# Patient Record
Sex: Male | Born: 1995 | Hispanic: No | Smoking: Former smoker
Health system: Southern US, Community
[De-identification: ages and names within clinical notes are randomized; demographics above are authoritative.]

## PROBLEM LIST (undated history)

## (undated) DIAGNOSIS — L309 Dermatitis, unspecified: Secondary | ICD-10-CM

## (undated) DIAGNOSIS — R040 Epistaxis: Secondary | ICD-10-CM

---
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
## 2017-02-07 ENCOUNTER — Emergency Department (HOSPITAL_BASED_OUTPATIENT_CLINIC_OR_DEPARTMENT_OTHER)
Admission: EM | Admit: 2017-02-07 | Discharge: 2017-02-07 | Disposition: A | Discharge status: Home / Self Care | Payer: Medicaid Other | Attending: Emergency Medicine | Admitting: Emergency Medicine

## 2017-02-07 ENCOUNTER — Encounter (HOSPITAL_BASED_OUTPATIENT_CLINIC_OR_DEPARTMENT_OTHER): Payer: Self-pay | Admitting: *Deleted

## 2017-02-07 DIAGNOSIS — R0602 Shortness of breath: Secondary | ICD-10-CM | POA: Diagnosis present

## 2017-02-07 DIAGNOSIS — F17210 Nicotine dependence, cigarettes, uncomplicated: Secondary | ICD-10-CM | POA: Diagnosis not present

## 2017-02-07 DIAGNOSIS — J4521 Mild intermittent asthma with (acute) exacerbation: Secondary | ICD-10-CM

## 2017-02-07 MED ORDER — ALBUTEROL SULFATE HFA 108 (90 BASE) MCG/ACT IN AERS: 1.0000 | INHALATION_SPRAY | Freq: Four times a day (QID) | RESPIRATORY_TRACT | 0 refills | Status: DC | PRN

## 2017-02-07 MED ORDER — LORATADINE 10 MG PO TABS: 10.0000 mg | ORAL_TABLET | Freq: Every day | ORAL | 0 refills | Status: DC

## 2017-02-07 MED ORDER — ALBUTEROL SULFATE (2.5 MG/3ML) 0.083% IN NEBU
7.5000 mg | INHALATION_SOLUTION | Freq: Once | RESPIRATORY_TRACT | Status: AC
  Administered 2017-02-07: 7.5 mg via RESPIRATORY_TRACT
  Filled 2017-02-07: qty 9

## 2017-02-07 MED ORDER — LORATADINE 10 MG PO TABS
10.0000 mg | ORAL_TABLET | Freq: Once | ORAL | Status: AC
  Administered 2017-02-07: 10 mg via ORAL
  Filled 2017-02-07: qty 1

## 2017-02-07 MED ORDER — IPRATROPIUM-ALBUTEROL 0.5-2.5 (3) MG/3ML IN SOLN
3.0000 mL | Freq: Once | RESPIRATORY_TRACT | Status: AC
  Administered 2017-02-07: 3 mL via RESPIRATORY_TRACT
  Filled 2017-02-07: qty 3

## 2017-02-07 MED ORDER — ALBUTEROL SULFATE HFA 108 (90 BASE) MCG/ACT IN AERS
2.0000 | INHALATION_SPRAY | RESPIRATORY_TRACT | Status: DC
  Administered 2017-02-07: 2 via RESPIRATORY_TRACT
  Filled 2017-02-07: qty 6.7

## 2017-02-07 MED ORDER — PREDNISONE 20 MG PO TABS: ORAL_TABLET | ORAL | 0 refills | Status: DC

## 2017-02-07 MED ORDER — PREDNISONE 50 MG PO TABS
60.0000 mg | ORAL_TABLET | Freq: Once | ORAL | Status: AC
  Administered 2017-02-07: 60 mg via ORAL
  Filled 2017-02-07: qty 1

## 2017-02-07 MED ORDER — BECLOMETHASONE DIPROPIONATE 40 MCG/ACT IN AERS: 2.0000 | INHALATION_SPRAY | Freq: Two times a day (BID) | RESPIRATORY_TRACT | 0 refills | Status: DC

## 2017-02-07 NOTE — ED Triage Notes (Signed)
Pt out of inhalers x 1 weeks states that his doctor will not renew RX presents with [facility] and chest tightness

## 2017-02-07 NOTE — ED Provider Notes (Signed)
[facility]   CSN: [id number] Arrival date & time: 02/07/17  0443     History   Chief Complaint Chief Complaint  Patient presents with  . Shortness of Breath    HPI Gavin Riley is a 21 y.o. male.  The history is provided by the patient.  Wheezing   This is a chronic problem. The current episode started more than 1 week ago. The problem occurs constantly. The problem has been gradually worsening. Pertinent negatives include no chest pain, no fever, no abdominal pain, no vomiting, no diarrhea, no dysuria, no coryza, no ear pain, no headaches, no rhinorrhea, no sore throat, no swollen glands, no neck pain, no cough, no hemoptysis, no sputum production and no rash. The problem's precipitants include smoke. He has tried nothing for the symptoms. The treatment provided no relief. He has had prior ED visits. He has had no prior ICU admissions. His past medical history is significant for asthma.  Out of both his inhalers for one month.  Is smoking.  No leg pain or swelling.  No CP no DOE.  States his doctor will not refill his medications as he has not followed up.    Past Medical History:  Diagnosis Date  . Asthma   . Eczema     There are no active problems to display for this patient.   History reviewed. No pertinent surgical history.     Home Medications    Prior to Admission medications   Medication Sig Start Date End Date Taking? Authorizing Provider  albuterol (PROVENTIL HFA;VENTOLIN HFA) 108 (90 Base) MCG/ACT inhaler Inhale 1-2 puffs into the lungs every 6 (six) hours as needed for wheezing or shortness of breath. 02/07/17   Lutie Pickler, MD  beclomethasone (QVAR) 40 MCG/ACT inhaler Inhale 2 puffs into the lungs 2 (two) times daily. 02/07/17   Khayri Kargbo, MD  loratadine (CLARITIN) 10 MG tablet Take 1 tablet (10 mg total) by mouth daily. 02/07/17   Locke Barrell, MD  predniSONE (DELTASONE) 20 MG tablet 3 tabs po day one, then 2 po daily  x 4 days 02/07/17   Nicanor Alcon, Davia Smyre, MD    Family History History reviewed. No pertinent family history.  Social History Social History  Substance Use Topics  . Smoking status: Current Every Day Smoker    Packs/day: 0.50    Types: Cigarettes  . Smokeless tobacco: Never Used  . Alcohol use No     Allergies   Shrimp [shellfish allergy]   Review of Systems Review of Systems  Constitutional: Negative for fever.  HENT: Negative for ear pain, rhinorrhea and sore throat.   Respiratory: Positive for wheezing. Negative for cough, hemoptysis and sputum production.   Cardiovascular: Negative for chest pain.  Gastrointestinal: Negative for abdominal pain, diarrhea and vomiting.  Genitourinary: Negative for dysuria.  Musculoskeletal: Negative for neck pain.  Skin: Negative for rash.  Neurological: Negative for headaches.  All other systems reviewed and are negative.    Physical Exam Updated Vital Signs BP (!) 131/93   Pulse 67   Temp 97.9 F (36.6 C) (Oral)   Resp 18   Ht 6' (1.829 m)   Wt 83.9 kg (185 lb)   BMI 25.09 kg/m   Physical Exam  Constitutional: He is oriented to person, place, and time. He appears well-developed and well-nourished. No distress.  HENT:  Head: Normocephalic and atraumatic.  Mouth/Throat: No oropharyngeal exudate.  Eyes: Pupils are equal, round, and reactive to light. Conjunctivae are normal.  Neck: Normal range of motion. Neck supple.  Cardiovascular: Normal rate, regular rhythm, normal heart sounds and intact distal pulses.   Pulmonary/Chest: Effort normal. No stridor. No respiratory distress. He has wheezes. He has no rales. He exhibits no tenderness.  Abdominal: Soft. Bowel sounds are normal. There is no tenderness.  Musculoskeletal: Normal range of motion.  Neurological: He is alert and oriented to person, place, and time. He displays normal reflexes.  Skin: Skin is warm and dry. Capillary refill takes less than 2 seconds.  Psychiatric: He  has a normal mood and affect.     ED Treatments / Results   Vitals:   02/07/17 0449 02/07/17 0450  BP:  (!) 131/93  Pulse: 67   Resp:  18  Temp: 97.9 F (36.6 C)    Procedures Procedures (including critical care time)  Medications Ordered in ED Medications  albuterol (PROVENTIL) (2.5 MG/3ML) 0.083% nebulizer solution 7.5 mg (not administered)  ipratropium-albuterol (DUONEB) 0.5-2.5 (3) MG/3ML nebulizer solution 3 mL (not administered)  predniSONE (DELTASONE) tablet 60 mg (not administered)  loratadine (CLARITIN) tablet 10 mg (not administered)    Clear post neb treatment, sats 100% stable for discharge  PERC negative wells 0 highly doubt PE in this very low risk patient  Final Clinical Impressions(s) / ED Diagnoses   Counseled that it is imperative that the patient stop smoking. Follow up with your PMD for recheck this week and to develop an action plan.  The patient is very well appearing and has been observed in the ED.  Strict return precautions given for  intractable rash, swelling or the lips tongue or floor of the mouth, chest pain, dyspnea on exertion, wheezing, weakness, vomiting, diarrhea,  Inability to tolerate liquids or food, fevers > 101 that are uncontrolled, neck stiffness, rashes on the skin, altered mental status or any concerns. No signs of systemic illness or infection. The patient is nontoxic-appearing on exam and vital signs are within normal limits.    I have reviewed the triage vital signs and the nursing notes. Pertinent labs &imaging results that were available during my care of the patient were reviewed by me and considered in my medical decision making (see chart for details).  After history, exam, and medical workup I feel the patient has been appropriately medically screened and is safe for discharge home. Pertinent diagnoses were discussed with the patient. Patient was given return precautions.   New Prescriptions New Prescriptions   ALBUTEROL  (PROVENTIL HFA;VENTOLIN HFA) 108 (90 BASE) MCG/ACT INHALER    Inhale 1-2 puffs into the lungs every 6 (six) hours as needed for wheezing or shortness of breath.   BECLOMETHASONE (QVAR) 40 MCG/ACT INHALER    Inhale 2 puffs into the lungs 2 (two) times daily.   LORATADINE (CLARITIN) 10 MG TABLET    Take 1 tablet (10 mg total) by mouth daily.   PREDNISONE (DELTASONE) 20 MG TABLET    3 tabs po day one, then 2 po daily x 4 days     Abdo Denault, MD 02/07/17 1610

## 2017-04-30 ENCOUNTER — Encounter (HOSPITAL_BASED_OUTPATIENT_CLINIC_OR_DEPARTMENT_OTHER): Payer: Self-pay | Admitting: *Deleted

## 2017-04-30 ENCOUNTER — Emergency Department (HOSPITAL_BASED_OUTPATIENT_CLINIC_OR_DEPARTMENT_OTHER)
Admission: EM | Admit: 2017-04-30 | Discharge: 2017-05-01 | Disposition: A | Discharge status: Home / Self Care | Payer: Medicaid Other | Attending: Emergency Medicine | Admitting: Emergency Medicine

## 2017-04-30 DIAGNOSIS — F17210 Nicotine dependence, cigarettes, uncomplicated: Secondary | ICD-10-CM | POA: Diagnosis not present

## 2017-04-30 DIAGNOSIS — J4541 Moderate persistent asthma with (acute) exacerbation: Secondary | ICD-10-CM | POA: Diagnosis not present

## 2017-04-30 DIAGNOSIS — Z79899 Other long term (current) drug therapy: Secondary | ICD-10-CM | POA: Diagnosis not present

## 2017-04-30 DIAGNOSIS — R0602 Shortness of breath: Secondary | ICD-10-CM | POA: Diagnosis present

## 2017-04-30 MED ORDER — IPRATROPIUM-ALBUTEROL 0.5-2.5 (3) MG/3ML IN SOLN
3.0000 mL | Freq: Four times a day (QID) | RESPIRATORY_TRACT | Status: DC
  Administered 2017-04-30: 3 mL via RESPIRATORY_TRACT

## 2017-04-30 MED ORDER — ALBUTEROL SULFATE (2.5 MG/3ML) 0.083% IN NEBU
INHALATION_SOLUTION | RESPIRATORY_TRACT | Status: AC
  Administered 2017-04-30: 2.5 mg
  Filled 2017-04-30: qty 3

## 2017-04-30 MED ORDER — ALBUTEROL (5 MG/ML) CONTINUOUS INHALATION SOLN
15.0000 mg/h | INHALATION_SOLUTION | Freq: Once | RESPIRATORY_TRACT | Status: AC
  Administered 2017-04-30: 15 mg/h via RESPIRATORY_TRACT

## 2017-04-30 MED ORDER — IPRATROPIUM-ALBUTEROL 0.5-2.5 (3) MG/3ML IN SOLN
RESPIRATORY_TRACT | Status: AC
  Administered 2017-04-30: 3 mL via RESPIRATORY_TRACT
  Filled 2017-04-30: qty 3

## 2017-04-30 MED ORDER — ALBUTEROL (5 MG/ML) CONTINUOUS INHALATION SOLN
INHALATION_SOLUTION | RESPIRATORY_TRACT | Status: AC
  Administered 2017-04-30: 15 mg/h via RESPIRATORY_TRACT
  Filled 2017-04-30: qty 3

## 2017-04-30 MED ORDER — DEXAMETHASONE SODIUM PHOSPHATE 10 MG/ML IJ SOLN
10.0000 mg | Freq: Once | INTRAMUSCULAR | Status: AC
  Administered 2017-04-30: 10 mg via INTRAVENOUS
  Filled 2017-04-30: qty 1

## 2017-04-30 NOTE — ED Notes (Signed)
RT at bedside to admin breathing Tx. Dr. Wilkie AyeHorton in to eval

## 2017-04-30 NOTE — ED Triage Notes (Signed)
Hx of asthma, no inhaler, having difficulty speaking single words.

## 2017-05-01 ENCOUNTER — Emergency Department (HOSPITAL_BASED_OUTPATIENT_CLINIC_OR_DEPARTMENT_OTHER): Payer: Medicaid Other

## 2017-05-01 ENCOUNTER — Other Ambulatory Visit: Payer: Self-pay

## 2017-05-01 MED ORDER — BECLOMETHASONE DIPROPIONATE 40 MCG/ACT IN AERS: 2.0000 | INHALATION_SPRAY | Freq: Two times a day (BID) | RESPIRATORY_TRACT | 0 refills | Status: DC

## 2017-05-01 MED ORDER — ALBUTEROL SULFATE HFA 108 (90 BASE) MCG/ACT IN AERS: 2.0000 | INHALATION_SPRAY | RESPIRATORY_TRACT | 0 refills | Status: DC | PRN

## 2017-05-01 MED ORDER — ALBUTEROL SULFATE HFA 108 (90 BASE) MCG/ACT IN AERS
2.0000 | INHALATION_SPRAY | Freq: Once | RESPIRATORY_TRACT | Status: AC
  Administered 2017-05-01: 2 via RESPIRATORY_TRACT
  Filled 2017-05-01: qty 6.7

## 2017-05-01 NOTE — Progress Notes (Signed)
Albuterol mdi with spacer given to patient with instruction for use.  Patient with good understanding and return demonstration of proper technique.

## 2017-05-01 NOTE — ED Provider Notes (Signed)
[facility] EMERGENCY DEPARTMENT Provider Note   CSN: [id number] Arrival date & time: 04/30/17  2330     History   Chief Complaint Chief Complaint  Patient presents with  . Asthma    HPI Farron Carollee MassedS Soward is a 21 y.o. male.  HPI  Level 5 caveat for acuity of condition.  Patient presents with acute onset shortness of breath.  Initially he was unable to speak with increased work of breathing and wheezing.  Very limited history given patient's acuity of condition.  On recheck, after continuous DuoNeb.  Patient states he feels much better.  He can speak in short sentences.  No accessory muscle use or increased work of breathing.  Patient states over the last 2-3 weeks he has had increasing shortness of breath.  Cough and shortness of breath or works at night.  He often times has to stand up and move around.  Denies any recent fevers.  He is not currently taking any inhalers or daily asthma medications.  He has run out.  Denies any recent illnesses.  Past Medical History:  Diagnosis Date  . Asthma   . Eczema     There are no active problems to display for this patient.   History reviewed. No pertinent surgical history.     Home Medications    Prior to Admission medications   Medication Sig Start Date End Date Taking? Authorizing Provider  albuterol (PROVENTIL HFA;VENTOLIN HFA) 108 (90 Base) MCG/ACT inhaler Inhale 2 puffs every 4 (four) hours as needed into the lungs for wheezing or shortness of breath. 05/01/17   Horton, Mayer Maskerourtney F, MD  beclomethasone (QVAR) 40 MCG/ACT inhaler Inhale 2 puffs 2 (two) times daily into the lungs. 05/01/17   Horton, Mayer Maskerourtney F, MD  loratadine (CLARITIN) 10 MG tablet Take 1 tablet (10 mg total) by mouth daily. 02/07/17   Palumbo, April, MD  predniSONE (DELTASONE) 20 MG tablet 3 tabs po day one, then 2 po daily x 4 days 02/07/17   Nicanor AlconPalumbo, April, MD    Family History No family history on file.  Social History Social History    Tobacco Use  . Smoking status: Current Every Day Smoker    Packs/day: 0.50    Types: Cigarettes  . Smokeless tobacco: Never Used  Substance Use Topics  . Alcohol use: No  . Drug use: No     Allergies   Shrimp [shellfish allergy]   Review of Systems Review of Systems  Constitutional: Positive for fever.  Respiratory: Positive for cough, shortness of breath and wheezing.   Cardiovascular: Negative for chest pain.  Gastrointestinal: Negative for abdominal pain, nausea and vomiting.  All other systems reviewed and are negative.    Physical Exam Updated Vital Signs BP 136/85   Pulse (!) 103   Temp 98.4 F (36.9 C) (Axillary)   Resp 14   SpO2 100%   Physical Exam  Constitutional: He is oriented to person, place, and time.  Ill-appearing, increased respiratory rate, accessory muscle use  HENT:  Head: Normocephalic and atraumatic.  Cardiovascular: Regular rhythm and normal heart sounds.  No murmur heard. Tachycardia  Pulmonary/Chest: He is in respiratory distress. He has wheezes.  Increased respiratory rate, unable to speak except for short phrases and words, accessory muscle use, tight with expiratory wheeze in all lung fields  Abdominal: Soft. Bowel sounds are normal. There is no tenderness. There is no rebound.  Musculoskeletal: He exhibits no edema.  Neurological: He is alert and oriented to person, place, and  time.  Skin: Skin is warm and dry.  Psychiatric: He has a normal mood and affect.  Nursing note and vitals reviewed.    ED Treatments / Results  Labs (all labs ordered are listed, but only abnormal results are displayed) Labs Reviewed - No data to display  EKG  EKG Interpretation None       Radiology Dg Chest 2 View  Result Date: 05/01/2017 CLINICAL DATA:  21 year old male with asthma. Patient presenting with shortness of breath. EXAM: CHEST  2 VIEW COMPARISON:  Chest radiograph dated 07/06/2015 FINDINGS: The heart size and mediastinal  contours are within normal limits. Both lungs are clear. The visualized skeletal structures are unremarkable. IMPRESSION: No active cardiopulmonary disease. Electronically Signed   By: Elgie CollardArash  Radparvar M.D.   On: 05/01/2017 01:44    Procedures Procedures (including critical care time)  CRITICAL CARE Performed by: Shon BatonHORTON, COURTNEY F   Total critical care time: 25 minutes  Critical care time was exclusive of separately billable procedures and treating other patients.  Critical care was necessary to treat or prevent imminent or life-threatening deterioration.  Critical care was time spent personally by me on the following activities: development of treatment plan with patient and/or surrogate as well as nursing, discussions with consultants, evaluation of patient's response to treatment, examination of patient, obtaining history from patient or surrogate, ordering and performing treatments and interventions, ordering and review of laboratory studies, ordering and review of radiographic studies, pulse oximetry and re-evaluation of patient's condition.   Medications Ordered in ED Medications  ipratropium-albuterol (DUONEB) 0.5-2.5 (3) MG/3ML nebulizer solution 3 mL (3 mLs Nebulization Given 04/30/17 2335)  albuterol (PROVENTIL HFA;VENTOLIN HFA) 108 (90 Base) MCG/ACT inhaler 2 puff (not administered)  dexamethasone (DECADRON) injection 10 mg (10 mg Intravenous Given 04/30/17 2345)  albuterol (PROVENTIL,VENTOLIN) solution continuous neb (15 mg/hr Nebulization Given 04/30/17 2350)  albuterol (PROVENTIL) (2.5 MG/3ML) 0.083% nebulizer solution (2.5 mg  Given 04/30/17 2335)     Initial Impression / Assessment and Plan / ED Course  I have reviewed the triage vital signs and the nursing notes.  Pertinent labs & imaging results that were available during my care of the patient were reviewed by me and considered in my medical decision making (see chart for details).  Clinical Course as of May 01 217  [facility]  0139 Feels much better.  No longer in respiratory distress.  Fair air movement with occasional wheeze noted.  [CH]  0217 Ambulated to maintain pulse ox to 96%.  Has better aeration.  Will discharge home with inhalers.  [CH]    Clinical Course User Index [CH] Horton, Mayer Maskerourtney F, MD    Patient presents with wheezing.  Initially was unable to provide much history.  He does have a history of asthma and is not compliant with a daily Qvar.  He was given a continuous neb and Decadron.  After continuous neb, he is much improved.  See rechecks above.  Denies any infectious symptoms.  Chest x-ray is clear.  Suspect recent change in weather causing worsening nighttime symptoms of asthma.  He was able to ambulate and maintain his pulse ox.  He was provided with an inhaler.  Qvar and albuterol refilled.  After history, exam, and medical workup I feel the patient has been appropriately medically screened and is safe for discharge home. Pertinent diagnoses were discussed with the patient. Patient was given return precautions.   Final Clinical Impressions(s) / ED Diagnoses   Final diagnoses:  Moderate persistent  asthma with exacerbation    ED Discharge Orders        Ordered    albuterol (PROVENTIL HFA;VENTOLIN HFA) 108 (90 Base) MCG/ACT inhaler  Every 4 hours PRN     05/01/17 0218    beclomethasone (QVAR) 40 MCG/ACT inhaler  2 times daily     05/01/17 0218       Horton, Mayer Masker, MD 05/01/17 0222

## 2017-05-01 NOTE — ED Notes (Signed)
Pt discharged to home NAD.  

## 2017-05-01 NOTE — ED Notes (Signed)
Pulse ox 93% while ambulating. No complaints from patient. Dr Wilkie AyeHorton notified.

## 2017-06-12 ENCOUNTER — Emergency Department (HOSPITAL_BASED_OUTPATIENT_CLINIC_OR_DEPARTMENT_OTHER)
Admission: EM | Admit: 2017-06-12 | Discharge: 2017-06-12 | Disposition: A | Discharge status: Home / Self Care | Payer: Medicaid Other | Attending: Emergency Medicine | Admitting: Emergency Medicine

## 2017-06-12 ENCOUNTER — Other Ambulatory Visit: Payer: Self-pay

## 2017-06-12 ENCOUNTER — Encounter (HOSPITAL_BASED_OUTPATIENT_CLINIC_OR_DEPARTMENT_OTHER): Payer: Self-pay | Admitting: Adult Health

## 2017-06-12 DIAGNOSIS — F17210 Nicotine dependence, cigarettes, uncomplicated: Secondary | ICD-10-CM | POA: Insufficient documentation

## 2017-06-12 DIAGNOSIS — J4541 Moderate persistent asthma with (acute) exacerbation: Secondary | ICD-10-CM | POA: Insufficient documentation

## 2017-06-12 DIAGNOSIS — R0602 Shortness of breath: Secondary | ICD-10-CM | POA: Diagnosis present

## 2017-06-12 MED ORDER — ALBUTEROL SULFATE HFA 108 (90 BASE) MCG/ACT IN AERS
2.0000 | INHALATION_SPRAY | Freq: Once | RESPIRATORY_TRACT | Status: AC
  Administered 2017-06-12: 2 via RESPIRATORY_TRACT

## 2017-06-12 MED ORDER — ALBUTEROL (5 MG/ML) CONTINUOUS INHALATION SOLN
15.0000 mg/h | INHALATION_SOLUTION | RESPIRATORY_TRACT | Status: DC
  Administered 2017-06-12: 15 mg/h via RESPIRATORY_TRACT
  Filled 2017-06-12: qty 20

## 2017-06-12 MED ORDER — IPRATROPIUM BROMIDE 0.02 % IN SOLN
1.0000 mg | Freq: Once | RESPIRATORY_TRACT | Status: AC
  Administered 2017-06-12: 1 mg via RESPIRATORY_TRACT
  Filled 2017-06-12: qty 5

## 2017-06-12 MED ORDER — ALBUTEROL SULFATE HFA 108 (90 BASE) MCG/ACT IN AERS: 1.0000 | INHALATION_SPRAY | Freq: Four times a day (QID) | RESPIRATORY_TRACT | 0 refills | Status: DC | PRN

## 2017-06-12 MED ORDER — BUDESONIDE-FORMOTEROL FUMARATE 80-4.5 MCG/ACT IN AERO: 2.0000 | INHALATION_SPRAY | Freq: Every morning | RESPIRATORY_TRACT | 0 refills | Status: DC

## 2017-06-12 MED ORDER — IPRATROPIUM-ALBUTEROL 0.5-2.5 (3) MG/3ML IN SOLN
3.0000 mL | Freq: Once | RESPIRATORY_TRACT | Status: AC
  Administered 2017-06-12: 3 mL via RESPIRATORY_TRACT
  Filled 2017-06-12: qty 3

## 2017-06-12 MED ORDER — PREDNISONE 20 MG PO TABS: 40.0000 mg | ORAL_TABLET | Freq: Every day | ORAL | 0 refills | Status: AC

## 2017-06-12 MED ORDER — ALBUTEROL SULFATE HFA 108 (90 BASE) MCG/ACT IN AERS
2.0000 | INHALATION_SPRAY | Freq: Once | RESPIRATORY_TRACT | Status: DC
  Filled 2017-06-12: qty 6.7

## 2017-06-12 MED ORDER — PREDNISONE 50 MG PO TABS
60.0000 mg | ORAL_TABLET | Freq: Once | ORAL | Status: AC
  Administered 2017-06-12: 21:00:00 60 mg via ORAL
  Filled 2017-06-12: qty 1

## 2017-06-12 MED ORDER — ALBUTEROL SULFATE (2.5 MG/3ML) 0.083% IN NEBU
2.5000 mg | INHALATION_SOLUTION | Freq: Once | RESPIRATORY_TRACT | Status: AC
  Administered 2017-06-12: 2.5 mg via RESPIRATORY_TRACT
  Filled 2017-06-12: qty 3

## 2017-06-12 NOTE — Discharge Instructions (Signed)
We believe that your symptoms are caused today by an exacerbation of your asthma.  Please take the prescribed medications and any medications that you have at home.  Follow up with your doctor as recommended.  If you develop any new or worsening symptoms, including but not limited to fever, persistent vomiting, worsening shortness of breath, or other symptoms that concern you, please return to the Emergency Department immediately. ° ° °Asthma °Asthma is a recurring condition in which the airways tighten and narrow. Asthma can make it difficult to breathe. It can cause coughing, wheezing, and shortness of breath. Asthma episodes, also called asthma attacks, range from minor to life-threatening. Asthma cannot be cured, but medicines and lifestyle changes can help control it. °CAUSES °Asthma is believed to be caused by inherited (genetic) and environmental factors, but its exact cause is unknown. Asthma may be triggered by allergens, lung infections, or irritants in the air. Asthma triggers are different for each person. Common triggers include:  °Animal dander. °Dust mites. °Cockroaches. °Pollen from trees or grass. °Mold. °Smoke. °Air pollutants such as dust, household cleaners, hair sprays, aerosol sprays, paint fumes, strong chemicals, or strong odors. °Cold air, weather changes, and winds (which increase molds and pollens in the air). °Strong emotional expressions such as crying or laughing hard. °Stress. °Certain medicines (such as aspirin) or types of drugs (such as beta-blockers). °Sulfites in foods and drinks. Foods and drinks that may contain sulfites include dried fruit, potato chips, and sparkling grape juice. °Infections or inflammatory conditions such as the flu, a cold, or an inflammation of the nasal membranes (rhinitis). °Gastroesophageal reflux disease (GERD). °Exercise or strenuous activity. °SYMPTOMS °Symptoms may occur immediately after asthma is triggered or many hours later. Symptoms  include: °Wheezing. °Excessive nighttime or early morning coughing. °Frequent or severe coughing with a common cold. °Chest tightness. °Shortness of breath. °DIAGNOSIS  °The diagnosis of asthma is made by a review of your medical history and a physical exam. Tests may also be performed. These may include: °Lung function studies. These tests show how much air you breathe in and out. °Allergy tests. °Imaging tests such as X-rays. °TREATMENT  °Asthma cannot be cured, but it can usually be controlled. Treatment involves identifying and avoiding your asthma triggers. It also involves medicines. There are 2 classes of medicine used for asthma treatment:  °Controller medicines. These prevent asthma symptoms from occurring. They are usually taken every day. °Reliever or rescue medicines. These quickly relieve asthma symptoms. They are used as needed and provide short-term relief. °Your health care provider will help you create an asthma action plan. An asthma action plan is a written plan for managing and treating your asthma attacks. It includes a list of your asthma triggers and how they may be avoided. It also includes information on when medicines should be taken and when their dosage should be changed. An action plan may also involve the use of a device called a peak flow meter. A peak flow meter measures how well the lungs are working. It helps you monitor your condition. °HOME CARE INSTRUCTIONS  °Take medicines only as directed by your health care provider. Speak with your health care provider if you have questions about how or when to take the medicines. °Use a peak flow meter as directed by your health care provider. Record and keep track of readings. °Understand and use the action plan to help minimize or stop an asthma attack without needing to seek medical care. °Control your home environment in the following   ways to help prevent asthma attacks: °Do not smoke. Avoid being exposed to secondhand smoke. °Change  your heating and air conditioning filter regularly. °Limit your use of fireplaces and wood stoves. °Get rid of pests (such as roaches and mice) and their droppings. °Throw away plants if you see mold on them. °Clean your floors and dust regularly. Use unscented cleaning products. °Try to have someone else vacuum for you regularly. Stay out of rooms while they are being vacuumed and for a short while afterward. If you vacuum, use a dust mask from a hardware store, a double-layered or microfilter vacuum cleaner bag, or a vacuum cleaner with a HEPA filter. °Replace carpet with wood, tile, or vinyl flooring. Carpet can trap dander and dust. °Use allergy-proof pillows, mattress covers, and box spring covers. °Wash bed sheets and blankets every week in hot water and dry them in a dryer. °Use blankets that are made of polyester or cotton. °Clean bathrooms and kitchens with bleach. If possible, have someone repaint the walls in these rooms with mold-resistant paint. Keep out of the rooms that are being cleaned and painted. °Wash hands frequently. °SEEK MEDICAL CARE IF:  °You have wheezing, shortness of breath, or a cough even if taking medicine to prevent attacks. °The colored mucus you cough up (sputum) is thicker than usual. °Your sputum changes from clear or white to yellow, green, gray, or bloody. °You have any problems that may be related to the medicines you are taking (such as a rash, itching, swelling, or trouble breathing). °You are using a reliever medicine more than 2-3 times per week. °Your peak flow is still at 50-79% of your personal best after following your action plan for 1 hour. °You have a fever. °SEEK IMMEDIATE MEDICAL CARE IF:  °You seem to be getting worse and are unresponsive to treatment during an asthma attack. °You are short of breath even at rest. °You get short of breath when doing very little physical activity. °You have difficulty eating, drinking, or talking due to asthma symptoms. °You  develop chest pain. °You develop a fast heartbeat. °You have a bluish color to your lips or fingernails. °You are light-headed, dizzy, or faint. °Your peak flow is less than 50% of your personal best. °MAKE SURE YOU:  °Understand these instructions. °Will watch your condition. °Will get help right away if you are not doing well or get worse. °Document Released: 06/06/2005 Document Revised: 10/21/2013 Document Reviewed: 01/03/2013 °Exit[employer]® Patient Information ©2015 [employer]. This information is not intended to replace advice given to you by your health care provider. Make sure you discuss any questions you have with your health care provider. ° °How to Use a Nebulizer °If you have asthma or other breathing problems, you might need to breathe in (inhale) medicine. This can be done with a nebulizer. A nebulizer is a device that turns liquid medicine into a mist that you can inhale.  °There are different kinds of nebulizers. Most are small. With some, you breathe in through a mouthpiece. With others, a mask fits over your nose and mouth. Most nebulizers must be connected to a small air compressor. Air is forced through tubing from the compressor to the nebulizer. The forced air changes the liquid into a fine spray. °RISKS AND COMPLICATIONS °The nebulizer must work properly for it to help your breathing. If the nebulizer does not produce mist, or if foam comes out, this indicates that the nebulizer is not working properly. Sometimes a filter can get clogged, or   there might be a problem with the air compressor. Check the instruction booklet that came with your nebulizer. It should tell you how to fix problems or where to call for help. You should have at least one extra nebulizer at home. That way, you will always have one when you need it.  °HOW TO PREPARE BEFORE USING THE NEBULIZER °Take these steps before using the nebulizer: °Check your medicine. Make sure it has not expired and is not damaged in any way.    °Wash your hands with soap and water.   °Put all the parts of your nebulizer on a sturdy, flat surface. Make sure the tubing connects the compressor and the nebulizer. °Measure the liquid medicine according to your health care provider's instructions. Pour it into the nebulizer. °Attach the mouthpiece or mask.   °Test the nebulizer by turning it on to make sure a spray is coming out. Then, turn it off.   °HOW TO USE THE NEBULIZER °Sit down and focus on staying relaxed.   °If your nebulizer has a mask, put it over your nose and mouth. If you use a mouthpiece, put it in your mouth. Press your lips firmly around the mouthpiece. °Turn on the nebulizer.   °Breathe out.   °Some nebulizers have a finger valve. If yours does, cover up the air hole so the air gets to the nebulizer. °Once the medicine begins to mist out, take slow, deep breaths. If there is a finger valve, release it at the end of your breath. °Continue taking slow, deep breaths until the nebulizer is empty.   °Be sure to stop the machine at any point if you start coughing or if the medicine foams or bubbles. °HOW TO CLEAN THE NEBULIZER  °The nebulizer and all its parts must be kept very clean. Follow the manufacturer's instructions for cleaning. For most nebulizers, you should follow these guidelines: °Wash the nebulizer after each use. Use warm water and soap. Rinse it well. Shake the nebulizer to remove extra water. Put it on a clean towel until it is completely dry. To make sure it is dry, put the nebulizer back together. Turn on the compressor for a few minutes. This will blow air through the nebulizer.   °Do not wash the tubing or the finger valve.   °Store the nebulizer in a dust-free place.   °Inspect the filter every week. Replace it any time it looks dirty.   °Sometimes the nebulizer will need a more complete cleaning. The instruction booklet should say how often you need to do this. °SEEK MEDICAL CARE IF:  °You continue to have difficulty  breathing.   °You have trouble using the nebulizer.   °Document Released: 05/25/2009 Document Revised: 10/21/2013 Document Reviewed: 11/26/2012 °Exit[employer]® Patient Information ©2015 [employer]. This information is not intended to replace advice given to you by your health care provider. Make sure you discuss any questions you have with your health care provider. ° °How to Use an Inhaler °Proper inhaler technique is very important. Good technique ensures that the medicine reaches the lungs. Poor technique results in depositing the medicine on the tongue and back of the throat rather than in the airways. If you do not use the inhaler with good technique, the medicine will not help you. °STEPS TO FOLLOW IF USING AN INHALER WITHOUT AN EXTENSION TUBE °Remove the cap from the inhaler. °If you are using the inhaler for the first time, you will need to prime it. Shake the inhaler for   5 seconds and release four puffs into the air, away from your face. Ask your health care provider or pharmacist if you have questions about priming your inhaler. °Shake the inhaler for 5 seconds before each breath in (inhalation). °Position the inhaler so that the top of the canister faces up. °Put your index finger on the top of the medicine canister. Your thumb supports the bottom of the inhaler. °Open your mouth. °Either place the inhaler between your teeth and place your lips tightly around the mouthpiece, or hold the inhaler 1-2 inches away from your open mouth. If you are unsure of which technique to use, ask your health care provider. °Breathe out (exhale) normally and as completely as possible. °Press the canister down with your index finger to release the medicine. °At the same time as the canister is pressed, inhale deeply and slowly until your lungs are completely filled. This should take 4-6 seconds. Keep your tongue down. °Hold the medicine in your lungs for 5-10 seconds (10 seconds is best). This helps the medicine get into the  small airways of your lungs. °Breathe out slowly, through pursed lips. Whistling is an example of pursed lips. °Wait at least 15-30 seconds between puffs. Continue with the above steps until you have taken the number of puffs your health care provider has ordered. Do not use the inhaler more than your health care provider tells you. °Replace the cap on the inhaler. °Follow the directions from your health care provider or the inhaler insert for cleaning the inhaler. °STEPS TO FOLLOW IF USING AN INHALER WITH AN EXTENSION (SPACER) °Remove the cap from the inhaler. °If you are using the inhaler for the first time, you will need to prime it. Shake the inhaler for 5 seconds and release four puffs into the air, away from your face. Ask your health care provider or pharmacist if you have questions about priming your inhaler. °Shake the inhaler for 5 seconds before each breath in (inhalation). °Place the open end of the spacer onto the mouthpiece of the inhaler. °Position the inhaler so that the top of the canister faces up and the spacer mouthpiece faces you. °Put your index finger on the top of the medicine canister. Your thumb supports the bottom of the inhaler and the spacer. °Breathe out (exhale) normally and as completely as possible. °Immediately after exhaling, place the spacer between your teeth and into your mouth. Close your lips tightly around the spacer. °Press the canister down with your index finger to release the medicine. °At the same time as the canister is pressed, inhale deeply and slowly until your lungs are completely filled. This should take 4-6 seconds. Keep your tongue down and out of the way. °Hold the medicine in your lungs for 5-10 seconds (10 seconds is best). This helps the medicine get into the small airways of your lungs. Exhale. °Repeat inhaling deeply through the spacer mouthpiece. Again hold that breath for up to 10 seconds (10 seconds is best). Exhale slowly. If it is difficult to take  this second deep breath through the spacer, breathe normally several times through the spacer. Remove the spacer from your mouth. °Wait at least 15-30 seconds between puffs. Continue with the above steps until you have taken the number of puffs your health care provider has ordered. Do not use the inhaler more than your health care provider tells you. °Remove the spacer from the inhaler, and place the cap on the inhaler. °Follow the directions from your health care provider   or the inhaler insert for cleaning the inhaler and spacer. °If you are using different kinds of inhalers, use your quick relief medicine to open the airways 10-15 minutes before using a steroid if instructed to do so by your health care provider. If you are unsure which inhalers to use and the order of using them, ask your health care provider, nurse, or respiratory therapist. °If you are using a steroid inhaler, always rinse your mouth with water after your last puff, then gargle and spit out the water. Do not swallow the water. °AVOID: °Inhaling before or after starting the spray of medicine. It takes practice to coordinate your breathing with triggering the spray. °Inhaling through the nose (rather than the mouth) when triggering the spray. °HOW TO DETERMINE IF YOUR INHALER IS FULL OR NEARLY EMPTY °You cannot know when an inhaler is empty by shaking it. A few inhalers are now being made with dose counters. Ask your health care provider for a prescription that has a dose counter if you feel you need that extra help. If your inhaler does not have a counter, ask your health care provider to help you determine the date you need to refill your inhaler. Write the refill date on a calendar or your inhaler canister. Refill your inhaler 7-10 days before it runs out. Be sure to keep an adequate supply of medicine. This includes making sure it is not expired, and that you have a spare inhaler.  °SEEK MEDICAL CARE IF:  °Your symptoms are only partially  relieved with your inhaler. °You are having trouble using your inhaler. °You have some increase in phlegm. °SEEK IMMEDIATE MEDICAL CARE IF:  °You feel little or no relief with your inhalers. You are still wheezing and are feeling shortness of breath or tightness in your chest or both. °You have dizziness, headaches, or a fast heart rate. °You have chills, fever, or night sweats. °You have a noticeable increase in phlegm production, or there is blood in the phlegm. °MAKE SURE YOU:  °Understand these instructions. °Will watch your condition. °Will get help right away if you are not doing well or get worse. °Document Released: 06/03/2000 Document Revised: 03/27/2013 Document Reviewed: 01/03/2013 °Exit[employer]® Patient Information ©2015 [employer]. This information is not intended to replace advice given to you by your health care provider. Make sure you discuss any questions you have with your health care provider. ° ° ° °

## 2017-06-12 NOTE — ED Triage Notes (Signed)
PResents with bilateral inspiratory and expiratory wheezes, speaking in short phrases, increased work of breathing and tachypnea that began today. He has been out of inhaler for a few months.

## 2017-06-12 NOTE — ED Notes (Signed)
[facility] in [facility]. Pt reports sx onset today. C/o sob & wheezing. Denies fever. "Prescribed QVAR, but does not use". Denies smoking (quit).   Alert, NAD, calm, interactive, increased wob, speaking in shortened phrases, wheezing noted, skin W&D, VSS).

## 2017-06-12 NOTE — ED Notes (Signed)
Printed coupon given to pt from [facility]

## 2017-06-12 NOTE — ED Provider Notes (Signed)
Emergency Department Provider Note   I have reviewed the triage vital signs and the nursing notes.   HISTORY  Chief Complaint Asthma   HPI Gavin Riley is a 21 y.o. male with PMH of asthma presents to the emergency department for evaluation of worsening shortness of breath and wheezing.  Symptoms began today.  Patient denies fever.  He has been out of his asthma medications for the past 2 months.  He quit smoking.  No sick contacts.  Denies chest pain or recent travel. No radiation of symptoms. No modifying factors. Patient has Rx for Qvar but cannot afford it and apparently lost his Medicaid.   Past Medical History:  Diagnosis Date  . Asthma   . Eczema     There are no active problems to display for this patient.   History reviewed. No pertinent surgical history.  Current Outpatient Rx  . Order #: [id number] Class: Print  . Order #: [id number] Class: Print  . Order #: [id number] Class: Print  . Order #: [id number] Class: Print    Allergies Shrimp [shellfish allergy]  History reviewed. No pertinent family history.  Social History Social History   Tobacco Use  . Smoking status: Current Every Day Smoker    Packs/day: 0.50    Types: Cigarettes  . Smokeless tobacco: Never Used  Substance Use Topics  . Alcohol use: No  . Drug use: No    Review of Systems  Constitutional: No fever/chills Eyes: No visual changes. ENT: No sore throat. Cardiovascular: Denies chest pain. Respiratory: Positive shortness of breath. Gastrointestinal: No abdominal pain.  No nausea, no vomiting.  No diarrhea.  No constipation. Genitourinary: Negative for dysuria. Musculoskeletal: Negative for back pain. Skin: Negative for rash. Neurological: Negative for headaches, focal weakness or numbness.  10-point ROS otherwise negative.  ____________________________________________   PHYSICAL EXAM:  VITAL SIGNS: ED Triage Vitals  Enc Vitals Group     BP --      Pulse Rate 06/12/17  2023 93     Resp 06/12/17 2023 (!) 26     Temp 06/12/17 2023 98.2 F (36.8 C)     Temp Source 06/12/17 2023 Oral     SpO2 06/12/17 2023 94 %     Weight 06/12/17 2028 185 lb (83.9 kg)   Constitutional: Alert and oriented. Well appearing and in no acute distress. Eyes: Conjunctivae are normal.  Head: Atraumatic. Nose: No congestion/rhinnorhea. Mouth/Throat: Mucous membranes are moist. Neck: No stridor.   Cardiovascular: Normal rate, regular rhythm. Good peripheral circulation. Grossly normal heart sounds.   Respiratory: Positive respiratory effort.  No retractions. Lungs diffuse inspiratory and expiratory wheezing. Poor air entry.  Gastrointestinal: Soft and nontender. No distention.  Musculoskeletal: No lower extremity tenderness nor edema. No gross deformities of extremities. Neurologic:  Normal speech and language. No gross focal neurologic deficits are appreciated.  Skin:  Skin is warm, dry and intact. No rash noted.  ____________________________________________  RADIOLOGY  None ____________________________________________   PROCEDURES  Procedure(s) performed:   Procedures  CRITICAL CARE Performed by: Maia PlanJoshua G Long Total critical care time: 60 minutes Critical care time was exclusive of separately billable procedures and treating other patients. Critical care was necessary to treat or prevent imminent or life-threatening deterioration. Critical care was time spent personally by me on the following activities: development of treatment plan with patient and/or surrogate as well as nursing, discussions with consultants, evaluation of patient's response to treatment, examination of patient, obtaining history from patient or surrogate, ordering and performing treatments and interventions, ordering  and review of laboratory studies, ordering and review of radiographic studies, pulse oximetry and re-evaluation of patient's condition.  Alona BeneJoshua Long, MD Emergency  Medicine  ____________________________________________   INITIAL IMPRESSION / ASSESSMENT AND PLAN / ED COURSE  Pertinent labs & imaging results that were available during my care of the patient were reviewed by me and considered in my medical decision making (see chart for details).  Patient presents with worsening asthma symptoms.  He is been out of his albuterol inhalers for the past 2 months.  Wheezing throughout.  DuoNeb but transition to continuous albuterol.  Continue to monitor closely. Exam is symmetrical. No CXR at this time.   09:26 PM Starting patient on his second hour of CAT. Looking better but has continued wheezing.   Patient feeling much better after 2 hours of CAT. Counseled on smoking cessation. Changed Qvar to Symbicort due to cost issues. [facility].   At this time, I do not feel there is any life-threatening condition present. I have reviewed and discussed all results (EKG, imaging, lab, urine as appropriate), exam findings with patient. I have reviewed nursing notes and appropriate previous records.  I feel the patient is safe to be discharged home without further emergent workup. Discussed usual and customary return precautions. Patient and family (if present) verbalize understanding and are comfortable with this plan.  Patient will follow-up with their primary care provider. If they do not have a primary care provider, information for follow-up has been provided to them. All questions have been answered.  ____________________________________________  FINAL CLINICAL IMPRESSION(S) / ED DIAGNOSES  Final diagnoses:  Moderate persistent asthma with exacerbation     MEDICATIONS GIVEN DURING THIS VISIT:  Medications  ipratropium-albuterol (DUONEB) 0.5-2.5 (3) MG/3ML nebulizer solution 3 mL (3 mLs Nebulization Given 06/12/17 2030)  albuterol (PROVENTIL) (2.5 MG/3ML) 0.083% nebulizer solution 2.5 mg (2.5 mg Nebulization Given 06/12/17  2030)  predniSONE (DELTASONE) tablet 60 mg (60 mg Oral Given 06/12/17 2036)  ipratropium (ATROVENT) nebulizer solution 1 mg (1 mg Nebulization Given 06/12/17 2039)  albuterol (PROVENTIL HFA;VENTOLIN HFA) 108 (90 Base) MCG/ACT inhaler 2 puff (2 puffs Inhalation Given 06/12/17 2237)     Note:  This document was prepared using Dragon voice recognition software and may include unintentional dictation errors.  Alona BeneJoshua Long, MD Emergency Medicine    Long, Arlyss RepressJoshua G, MD 06/13/17 [phone]

## 2017-12-14 ENCOUNTER — Emergency Department (HOSPITAL_COMMUNITY)
Admission: EM | Admit: 2017-12-14 | Discharge: 2017-12-14 | Disposition: A | Discharge status: Home / Self Care | Payer: Medicaid Other | Attending: Emergency Medicine | Admitting: Emergency Medicine

## 2017-12-14 ENCOUNTER — Encounter (HOSPITAL_COMMUNITY): Payer: Self-pay

## 2017-12-14 DIAGNOSIS — R062 Wheezing: Secondary | ICD-10-CM | POA: Diagnosis present

## 2017-12-14 DIAGNOSIS — J4521 Mild intermittent asthma with (acute) exacerbation: Secondary | ICD-10-CM | POA: Diagnosis not present

## 2017-12-14 MED ORDER — ALBUTEROL SULFATE HFA 108 (90 BASE) MCG/ACT IN AERS
2.0000 | INHALATION_SPRAY | Freq: Once | RESPIRATORY_TRACT | Status: DC
Start: 2017-12-14 — End: 2017-12-14
  Filled 2017-12-14: qty 6.7

## 2017-12-14 MED ORDER — ALBUTEROL SULFATE (2.5 MG/3ML) 0.083% IN NEBU
5.0000 mg | INHALATION_SOLUTION | Freq: Once | RESPIRATORY_TRACT | Status: AC
  Administered 2017-12-14: 5 mg via RESPIRATORY_TRACT
  Filled 2017-12-14: qty 6

## 2017-12-14 NOTE — Discharge Instructions (Addendum)
1.  It is very important that you have a family physician  to manage your asthma.  Make an appointment for recheck within 1 to 2 weeks. 2. Asthmatic should not smoke at all.  Quit all smoking.

## 2017-12-14 NOTE — ED Provider Notes (Signed)
[facility] EMERGENCY DEPARTMENT Provider Note   CSN: [id number] Arrival date & time: 12/14/17  0754     History   Chief Complaint Chief Complaint  Patient presents with  . Asthma    HPI Gavin Riley is a 22 y.o. male.  HPI Patient reports he usually has an inhaler if he gets wheezing.  He ran out about a week ago.  He reports he awakened this morning and felt like it was hard to breathe and he was wheezing.  No chest pain.  No cough.  No fever.  Patient reports he usually needs inhaler only intermittently and more often at night in the morning.  Patient reports he smokes occasionally. Past Medical History:  Diagnosis Date  . Asthma   . Eczema     There are no active problems to display for this patient.   History reviewed. No pertinent surgical history.      Home Medications    Prior to Admission medications   Medication Sig Start Date End Date Taking? Authorizing Provider  albuterol (PROVENTIL HFA;VENTOLIN HFA) 108 (90 Base) MCG/ACT inhaler Inhale 1-2 puffs into the lungs every 6 (six) hours as needed for wheezing or shortness of breath. 06/12/17   Long, Arlyss Repress, MD  budesonide-formoterol (SYMBICORT) 80-4.5 MCG/ACT inhaler Inhale 2 puffs into the lungs every morning. 06/12/17 07/12/17  Long, Arlyss Repress, MD  loratadine (CLARITIN) 10 MG tablet Take 1 tablet (10 mg total) by mouth daily. 02/07/17   Palumbo, April, MD    Family History No family history on file.  Social History Social History   Tobacco Use  . Smoking status: Current Every Day Smoker    Packs/day: 0.50    Types: Cigarettes  . Smokeless tobacco: Never Used  Substance Use Topics  . Alcohol use: No  . Drug use: No     Allergies   Shrimp [shellfish allergy]   Review of Systems Review of Systems 10 Systems reviewed and are negative for acute change except as noted in the HPI.  Physical Exam Updated Vital Signs BP 124/71   Pulse 77   Temp 98.6 F (37 C) (Oral)    Resp 15   SpO2 98%   Physical Exam  Constitutional: He is oriented to person, place, and time. He appears well-developed and well-nourished.  HENT:  Head: Normocephalic and atraumatic.  Nose: Nose normal.  Mouth/Throat: Oropharynx is clear and moist.  Eyes: EOM are normal.  Neck: Neck supple.  Cardiovascular: Normal rate, regular rhythm, normal heart sounds and intact distal pulses.  Pulmonary/Chest: Effort normal.  Inspiratory and expiratory wheeze diffusely through bilateral lung fields.  Abdominal: Soft. Bowel sounds are normal. He exhibits no distension. There is no tenderness.  Musculoskeletal: Normal range of motion. He exhibits no edema or tenderness.  Neurological: He is alert and oriented to person, place, and time. He has normal strength. Coordination normal. GCS eye subscore is 4. GCS verbal subscore is 5. GCS motor subscore is 6.  Skin: Skin is warm, dry and intact.  Psychiatric: He has a normal mood and affect.     ED Treatments / Results  Labs (all labs ordered are listed, but only abnormal results are displayed) Labs Reviewed - No data to display  EKG None  Radiology No results found.  Procedures Procedures (including critical care time)  Medications Ordered in ED Medications  albuterol (PROVENTIL HFA;VENTOLIN HFA) 108 (90 Base) MCG/ACT inhaler 2 puff (has no administration in time range)  albuterol (PROVENTIL) (2.5 MG/3ML)  0.083% nebulizer solution 5 mg (5 mg Nebulization Given 12/14/17 0819)     Initial Impression / Assessment and Plan / ED Course  I have reviewed the triage vital signs and the nursing notes.  Pertinent labs & imaging results that were available during my care of the patient were reviewed by me and considered in my medical decision making (see chart for details).  Clinical Course as of Dec 14 852  Thu Dec 14, 2017  0851 Patient feels much better.  Lungs are now clear to auscultation.   [MP]    Clinical Course User Index [MP]  Arby BarrettePfeiffer, Shree Espey, MD    Final Clinical Impressions(s) / ED Diagnoses   Final diagnoses:  Mild intermittent asthma with exacerbation  Patient has history of asthma.  He has run out of inhaler.  No recent fever chills cough or chest pain.  Describes episodic exacerbations.  He responded to nebulizer treatment with complete resolution of wheezing.  Patient is counseled on importance of regular medical care for asthmatics and complete smoking cessation.  He is discharged in good condition peer ED Discharge Orders    None       Arby BarrettePfeiffer, Milderd Manocchio, MD 12/14/17 [id number]

## 2017-12-14 NOTE — ED Triage Notes (Signed)
Pt presents for evaluation of asthma flare this AM. Reports inhaler is empty.

## 2018-01-14 ENCOUNTER — Emergency Department (HOSPITAL_COMMUNITY)
Admission: EM | Admit: 2018-01-14 | Discharge: 2018-01-14 | Disposition: A | Discharge status: Home / Self Care | Payer: Medicaid Other | Attending: Emergency Medicine | Admitting: Emergency Medicine

## 2018-01-14 ENCOUNTER — Encounter (HOSPITAL_COMMUNITY): Payer: Self-pay

## 2018-01-14 DIAGNOSIS — L259 Unspecified contact dermatitis, unspecified cause: Secondary | ICD-10-CM | POA: Insufficient documentation

## 2018-01-14 DIAGNOSIS — J4521 Mild intermittent asthma with (acute) exacerbation: Secondary | ICD-10-CM | POA: Insufficient documentation

## 2018-01-14 DIAGNOSIS — F17210 Nicotine dependence, cigarettes, uncomplicated: Secondary | ICD-10-CM | POA: Insufficient documentation

## 2018-01-14 DIAGNOSIS — L2082 Flexural eczema: Secondary | ICD-10-CM

## 2018-01-14 DIAGNOSIS — Z79899 Other long term (current) drug therapy: Secondary | ICD-10-CM | POA: Insufficient documentation

## 2018-01-14 MED ORDER — PREDNISONE 20 MG PO TABS: 40.0000 mg | ORAL_TABLET | Freq: Every day | ORAL | 0 refills | Status: AC

## 2018-01-14 MED ORDER — ALBUTEROL SULFATE HFA 108 (90 BASE) MCG/ACT IN AERS: 2.0000 | INHALATION_SPRAY | RESPIRATORY_TRACT | 1 refills | Status: DC | PRN

## 2018-01-14 MED ORDER — TRIAMCINOLONE ACETONIDE 0.1 % EX CREA: 1.0000 "application " | TOPICAL_CREAM | Freq: Two times a day (BID) | CUTANEOUS | 0 refills | Status: AC

## 2018-01-14 MED ORDER — ALBUTEROL SULFATE (2.5 MG/3ML) 0.083% IN NEBU
5.0000 mg | INHALATION_SOLUTION | Freq: Once | RESPIRATORY_TRACT | Status: AC
  Administered 2018-01-14: 5 mg via RESPIRATORY_TRACT

## 2018-01-14 MED ORDER — IPRATROPIUM-ALBUTEROL 0.5-2.5 (3) MG/3ML IN SOLN
3.0000 mL | Freq: Once | RESPIRATORY_TRACT | Status: AC
  Administered 2018-01-14: 3 mL via RESPIRATORY_TRACT
  Filled 2018-01-14: qty 3

## 2018-01-14 MED ORDER — ALBUTEROL SULFATE (2.5 MG/3ML) 0.083% IN NEBU
INHALATION_SOLUTION | RESPIRATORY_TRACT | Status: AC
  Filled 2018-01-14: qty 6

## 2018-01-14 MED ORDER — ALBUTEROL SULFATE HFA 108 (90 BASE) MCG/ACT IN AERS
1.0000 | INHALATION_SPRAY | RESPIRATORY_TRACT | Status: DC
  Administered 2018-01-14: 2 via RESPIRATORY_TRACT
  Filled 2018-01-14: qty 6.7

## 2018-01-14 MED ORDER — PREDNISONE 20 MG PO TABS
40.0000 mg | ORAL_TABLET | Freq: Once | ORAL | Status: AC
  Administered 2018-01-14: 40 mg via ORAL
  Filled 2018-01-14: qty 2

## 2018-01-14 NOTE — ED Triage Notes (Signed)
Pt c/o of asthma, out of inhaler for two days, recently around dogs which is one of his triggers, wheezing all lobes.

## 2018-01-14 NOTE — Discharge Instructions (Addendum)
Please consider taking a daily allergy medication to help with your symptoms.  I suggest a less drowsy 24 hour medication such as allegra, zyrtec or Claritin or the generic version.    Please obtain and follow-up with your primary care provider.  If you have any additional, your shortness of breath is not fixed with your albuterol inhaler, or your shortness of breath continues please seek additional medical care.

## 2018-01-14 NOTE — ED Provider Notes (Signed)
[facility] EMERGENCY DEPARTMENT Provider Note   CSN: [id number] Arrival date & time: 01/14/18  1930     History   Chief Complaint Chief Complaint  Patient presents with  . Asthma    HPI Gavin Riley is a 22 y.o. male with a past medical history of asthma, eczema, seasonal allergies, who presents today for evaluation of asthma.  He reports that he has been out of his asthma inhaler for about 2 days.  He has recently been around multiple dogs which is 1 of his triggers.  On triage note he was noted to be wheezing in all lobes.  He reports that after his treatment he still feels slightly tight but feels better.    HPI  Past Medical History:  Diagnosis Date  . Asthma   . Eczema     There are no active problems to display for this patient.   History reviewed. No pertinent surgical history.      Home Medications    Prior to Admission medications   Medication Sig Start Date End Date Taking? Authorizing Provider  albuterol (PROVENTIL HFA;VENTOLIN HFA) 108 (90 Base) MCG/ACT inhaler Inhale 2 puffs into the lungs every 4 (four) hours as needed for wheezing or shortness of breath. 01/14/18   Cristina GongHammond, Elizabeth W, PA-C  budesonide-formoterol [facility](SYMBICORT) 80-4.5 MCG/ACT inhaler Inhale 2 puffs into the lungs every morning. 06/12/17 07/12/17  Long, Arlyss RepressJoshua G, MD  loratadine (CLARITIN) 10 MG tablet Take 1 tablet (10 mg total) by mouth daily. 02/07/17   Palumbo, April, MD  predniSONE (DELTASONE) 20 MG tablet Take 2 tablets (40 mg total) by mouth daily for 3 days. 01/14/18 01/17/18  Cristina GongHammond, Elizabeth W, PA-C  triamcinolone cream (KENALOG) 0.1 % Apply 1 application topically 2 (two) times daily. 01/14/18   Cristina GongHammond, Elizabeth W, PA-C    Family History No family history on file.  Social History Social History   Tobacco Use  . Smoking status: Current Every Day Smoker    Packs/day: 0.50    Types: Cigarettes  . Smokeless tobacco: Never Used  Substance Use Topics  .  Alcohol use: No  . Drug use: No     Allergies   Shrimp [shellfish allergy]   Review of Systems Review of Systems  Constitutional: Negative for chills and fever.  Respiratory: Positive for chest tightness and shortness of breath.   Cardiovascular: Negative for chest pain and palpitations.  Gastrointestinal: Negative for abdominal pain.  Neurological: Negative for headaches.  All other systems reviewed and are negative.    Physical Exam Updated Vital Signs BP 124/81   Pulse 86   Temp 98.8 F (37.1 C) (Oral)   Resp 20   SpO2 96%   Physical Exam  Constitutional: He appears well-developed. No distress.  HENT:  Head: Normocephalic.  Neck: Normal range of motion. Neck supple.  Pulmonary/Chest: Effort normal. No respiratory distress. He has wheezes (Very mild diffusely). He exhibits no tenderness.  Neurological: He is alert.  Skin: Skin is warm and dry. He is not diaphoretic.  Diffuse dry skin, primary on flexor surfaces of arms.   Nursing note and vitals reviewed.    ED Treatments / Results  Labs (all labs ordered are listed, but only abnormal results are displayed) Labs Reviewed - No data to display  EKG None  Radiology No results found.  Procedures Procedures (including critical care time)  Medications Ordered in ED Medications  albuterol (PROVENTIL HFA;VENTOLIN HFA) 108 (90 Base) MCG/ACT inhaler 1-2 puff (2 puffs Inhalation  Given 01/14/18 2044)  albuterol (PROVENTIL) (2.5 MG/3ML) 0.083% nebulizer solution 5 mg (5 mg Nebulization Given 01/14/18 1939)  predniSONE (DELTASONE) tablet 40 mg (40 mg Oral Given 01/14/18 2043)  ipratropium-albuterol (DUONEB) 0.5-2.5 (3) MG/3ML nebulizer solution 3 mL (3 mLs Nebulization Given 01/14/18 2044)     Initial Impression / Assessment and Plan / ED Course  I have reviewed the triage vital signs and the nursing notes.  Pertinent labs & imaging results that were available during my care of the patient were reviewed by me and  considered in my medical decision making (see chart for details).    Patient presents today for evaluation of asthma exacerbation.  He reports that he is been out of his inhalers for about 2 days and recently has been around dogs which is 1 of his triggers.  Triage note reported generalized wheezing in all lobes, he was treated with 5 mg of albuterol prior to my evaluation.  On my exam he had mild wheezing diffusely.  He was treated with p.o. prednisone, DuoNeb, and given an albuterol inhaler with 2 puffs while in the department.  After this he reports feeling like he is back to his normal breathing and is at baseline.  He was given prescriptions for continued prednisone, in addition to 2 inhalers, and steroid cream for his eczema.  He was informed that he needs to start taking oral allergy medicine such as Claritin Allegra or Zyrtec and follow-up with primary care provider.  Return precautions were discussed, states understanding.  Afebrile and given diffuse nature no indication for chest x-ray at this time.  Patient discharged home.  Final Clinical Impressions(s) / ED Diagnoses   Final diagnoses:  Mild intermittent asthma with exacerbation  Flexural eczema    ED Discharge Orders        Ordered    triamcinolone cream (KENALOG) 0.1 %  2 times daily     01/14/18 2114    predniSONE (DELTASONE) 20 MG tablet  Daily     01/14/18 2114    albuterol (PROVENTIL HFA;VENTOLIN HFA) 108 (90 Base) MCG/ACT inhaler  Every 4 hours PRN     01/14/18 2114       Cristina Gong, PA-C 01/14/18 2125    Rolan Bucco, MD 01/14/18 2212

## 2018-01-14 NOTE — ED Notes (Signed)
Pt stable, ambulatory, states understanding of discharge instructions 

## 2018-05-21 ENCOUNTER — Encounter (HOSPITAL_COMMUNITY): Payer: Self-pay | Admitting: *Deleted

## 2018-05-21 ENCOUNTER — Other Ambulatory Visit: Payer: Self-pay

## 2018-05-21 ENCOUNTER — Emergency Department (HOSPITAL_COMMUNITY)
Admission: EM | Admit: 2018-05-21 | Discharge: 2018-05-21 | Discharge status: Against Medical Advice | Payer: Medicaid Other | Attending: Emergency Medicine | Admitting: Emergency Medicine

## 2018-05-21 DIAGNOSIS — Z5321 Procedure and treatment not carried out due to patient leaving prior to being seen by health care provider: Secondary | ICD-10-CM | POA: Insufficient documentation

## 2018-05-21 DIAGNOSIS — R109 Unspecified abdominal pain: Secondary | ICD-10-CM | POA: Diagnosis present

## 2018-05-21 LAB — COMPREHENSIVE METABOLIC PANEL
ALT: 21 U/L (ref 0–44)
AST: 28 U/L (ref 15–41)
Albumin: 4.7 g/dL (ref 3.5–5.0)
Alkaline Phosphatase: 61 U/L (ref 38–126)
Anion gap: 13 (ref 5–15)
BUN: 11 mg/dL (ref 6–20)
CO2: 23 mmol/L (ref 22–32)
CREATININE: 1.2 mg/dL (ref 0.61–1.24)
Calcium: 9.7 mg/dL (ref 8.9–10.3)
Chloride: 103 mmol/L (ref 98–111)
Glucose, Bld: 69 mg/dL — ABNORMAL LOW (ref 70–99)
Potassium: 3.8 mmol/L (ref 3.5–5.1)
Sodium: 139 mmol/L (ref 135–145)
TOTAL PROTEIN: 7.7 g/dL (ref 6.5–8.1)
Total Bilirubin: 1.4 mg/dL — ABNORMAL HIGH (ref 0.3–1.2)

## 2018-05-21 LAB — CBC
HCT: 48.7 % (ref 39.0–52.0)
Hemoglobin: 16.3 g/dL (ref 13.0–17.0)
MCH: 30.8 pg (ref 26.0–34.0)
MCHC: 33.5 g/dL (ref 30.0–36.0)
MCV: 92.1 fL (ref 80.0–100.0)
NRBC: 0 % (ref 0.0–0.2)
PLATELETS: 225 10*3/uL (ref 150–400)
RBC: 5.29 MIL/uL (ref 4.22–5.81)
RDW: 13.3 % (ref 11.5–15.5)
WBC: 4.7 10*3/uL (ref 4.0–10.5)

## 2018-05-21 LAB — LIPASE, BLOOD: Lipase: 31 U/L (ref 11–51)

## 2018-05-21 NOTE — ED Notes (Signed)
Pt stated he couldn't wait any longer. Pt has left

## 2018-05-21 NOTE — ED Triage Notes (Signed)
Pt c/o RUQ pain that started last night after he got home from work. Pt reports taking a deep breath or movement makes pain worse

## 2018-05-22 ENCOUNTER — Emergency Department (HOSPITAL_COMMUNITY): Payer: Medicaid Other

## 2018-05-22 ENCOUNTER — Encounter (HOSPITAL_COMMUNITY): Payer: Self-pay | Admitting: Emergency Medicine

## 2018-05-22 ENCOUNTER — Emergency Department (HOSPITAL_COMMUNITY)
Admission: EM | Admit: 2018-05-22 | Discharge: 2018-05-22 | Disposition: A | Discharge status: Home / Self Care | Payer: Medicaid Other | Attending: Emergency Medicine | Admitting: Emergency Medicine

## 2018-05-22 ENCOUNTER — Other Ambulatory Visit: Payer: Self-pay

## 2018-05-22 DIAGNOSIS — R0781 Pleurodynia: Secondary | ICD-10-CM | POA: Diagnosis not present

## 2018-05-22 DIAGNOSIS — J45909 Unspecified asthma, uncomplicated: Secondary | ICD-10-CM | POA: Insufficient documentation

## 2018-05-22 DIAGNOSIS — F17210 Nicotine dependence, cigarettes, uncomplicated: Secondary | ICD-10-CM | POA: Insufficient documentation

## 2018-05-22 DIAGNOSIS — R0789 Other chest pain: Secondary | ICD-10-CM | POA: Diagnosis present

## 2018-05-22 MED ORDER — NAPROXEN 500 MG PO TABS: 500.0000 mg | ORAL_TABLET | Freq: Two times a day (BID) | ORAL | 0 refills | Status: DC

## 2018-05-22 MED ORDER — NAPROXEN 250 MG PO TABS
500.0000 mg | ORAL_TABLET | Freq: Once | ORAL | Status: AC
  Administered 2018-05-22: 500 mg via ORAL
  Filled 2018-05-22: qty 2

## 2018-05-22 NOTE — ED Notes (Signed)
Patient transported to X-ray 

## 2018-05-22 NOTE — Discharge Instructions (Addendum)
You were evaluated in the Emergency Department and after careful evaluation, we did not find any emergent condition requiring admission or further testing in the hospital.  Your symptoms today seem to be due to inflammation or strain of the muscles of the ribs.  Your chest x-ray was normal.  Please use the anti-inflammatories prescribed as directed and try to limit yourself to light duty while at work for the next few days.  Please return to the Emergency Department if you experience any worsening of your condition.  We encourage you to follow up with a primary care provider.  Thank you for allowing us to be a part of your care.

## 2018-05-22 NOTE — ED Triage Notes (Signed)
Pt complains of sharp pain in right upper abd when he takes a deep breath in. Denies N/V. Denies any injuries

## 2018-05-22 NOTE — ED Notes (Signed)
Discharge instructions and prescription discussed with Pt. Pt verbalized understanding. Pt stable and ambulatory.    

## 2018-05-22 NOTE — ED Provider Notes (Signed)
[facility] Emergency Department Provider Note MRN:  [id number]  Arrival date & time: 05/22/18     Chief Complaint   Rib pain History of Present Illness   Gavin Riley is a 22 y.o. year-old male with a history of asthma presenting to the ED with chief complaint of rib pain.  Patient explains for the past 2 to 3 days, he has had increasing pain to his right anterior ribs/right upper quadrant abdomen.  Patient works at [employer] and lifts heavy boxes, thinks that he pulled a muscle.  Pain is worse with deep breathing.  Denies shortness of breath, no fever, no cough, no lower abdominal pain.  Checked into the emergency department last night but left prior to being evaluated.  Review of Systems  A complete 10 system review of systems was obtained and all systems are negative except as noted in the HPI and PMH.   Patient's Health History    Past Medical History:  Diagnosis Date  . Asthma   . Eczema     History reviewed. No pertinent surgical history.  History reviewed. No pertinent family history.  Social History   Socioeconomic History  . Marital status: Single    Spouse name: Not on file  . Number of children: Not on file  . Years of education: Not on file  . Highest education level: Not on file  Occupational History  . Not on file  Social Needs  . Financial resource strain: Not on file  . Food insecurity:    Worry: Not on file    Inability: Not on file  . Transportation needs:    Medical: Not on file    Non-medical: Not on file  Tobacco Use  . Smoking status: Current Every Day Smoker    Packs/day: 0.50    Types: Cigarettes  . Smokeless tobacco: Never Used  Substance and Sexual Activity  . Alcohol use: No  . Drug use: No  . Sexual activity: Not on file  Lifestyle  . Physical activity:    Days per week: Not on file    Minutes per session: Not on file  . Stress: Not on file  Relationships  . Social connections:    Talks on phone: Not on  file    Gets together: Not on file    Attends religious service: Not on file    Active member of club or organization: Not on file    Attends meetings of clubs or organizations: Not on file    Relationship status: Not on file  . Intimate partner violence:    Fear of current or ex partner: Not on file    Emotionally abused: Not on file    Physically abused: Not on file    Forced sexual activity: Not on file  Other Topics Concern  . Not on file  Social History Narrative  . Not on file     Physical Exam  Vital Signs and Nursing Notes reviewed Vitals:   05/22/18 1156  BP: 134/90  Pulse: 73  Resp: 16  Temp: 98.5 F (36.9 C)  SpO2: 97%    CONSTITUTIONAL: Well-appearing, NAD NEURO:  Alert and oriented x 3, no focal deficits EYES:  eyes equal and reactive ENT/NECK:  no LAD, no JVD CARDIO: Regular rate, well-perfused, normal S1 and S2; tenderness to palpation to the low right anterior ribs PULM:  CTAB no wheezing or rhonchi GI/GU:  normal bowel sounds, non-distended, non-tender MSK/SPINE:  No gross deformities, no edema SKIN:  no rash, atraumatic PSYCH:  Appropriate speech and behavior  Diagnostic and Interventional Summary    Labs Reviewed - No data to display  DG Chest 2 View  Final Result      Medications  naproxen (NAPROSYN) tablet 500 mg (500 mg Oral Given 05/22/18 1303)     Procedures   EMERGENCY DEPARTMENT BILIARY ULTRASOUND INTERPRETATION "Study: Limited Abdominal Ultrasound of the Gallbladder and Common Bile Duct."  INDICATIONS: Abdominal pain Indication: Multiple views of the gallbladder and common bile duct were obtained in real-time with a Multi-frequency probe."  PERFORMED BY:  Myself IMAGES ARCHIVED?: Yes LIMITATIONS: None INTERPRETATION: Normal   Critical Care  ED Course and Medical Decision Making  I have reviewed the triage vital signs and the nursing notes.  Pertinent labs & imaging results that were available during my care of the patient  were reviewed by me and considered in my medical decision making (see below for details).  Favoring MSK strain or costochondritis in this healthy 22 year old male.  Labs obtained last night reassuring.  Bedside ultrasound reveals normal gallbladder.  Will obtain screening chest x-ray to exclude spontaneous pneumothorax.  Chest x-ray unremarkable.  Patient is PERC negative.  Prescription for Naprosyn, strict return precautions for worsening shortness of breath.  After the discussed management above, the patient was determined to be safe for discharge.  The patient was in agreement with this plan and all questions regarding their care were answered.  ED return precautions were discussed and the patient will return to the ED with any significant worsening of condition.  Elmer SowMichael M. Pilar PlateBero, MD [facility] [email or web address]  Final Clinical Impressions(s) / ED Diagnoses     ICD-10-CM   1. Rib pain on right side R07.81 DG Chest 2 View    DG Chest 2 View    ED Discharge Orders         Ordered    naproxen (NAPROSYN) 500 MG tablet  2 times daily     05/22/18 1317             Sabas SousBero, Darren Nodal M, MD 05/22/18 1320

## 2018-07-02 ENCOUNTER — Emergency Department (HOSPITAL_COMMUNITY)
Admission: EM | Admit: 2018-07-02 | Discharge: 2018-07-02 | Disposition: A | Discharge status: Home / Self Care | Payer: Medicaid Other | Attending: Emergency Medicine | Admitting: Emergency Medicine

## 2018-07-02 ENCOUNTER — Encounter (HOSPITAL_COMMUNITY): Payer: Self-pay | Admitting: Emergency Medicine

## 2018-07-02 ENCOUNTER — Other Ambulatory Visit: Payer: Self-pay

## 2018-07-02 DIAGNOSIS — Z79899 Other long term (current) drug therapy: Secondary | ICD-10-CM | POA: Diagnosis not present

## 2018-07-02 DIAGNOSIS — J45901 Unspecified asthma with (acute) exacerbation: Secondary | ICD-10-CM | POA: Insufficient documentation

## 2018-07-02 DIAGNOSIS — F17210 Nicotine dependence, cigarettes, uncomplicated: Secondary | ICD-10-CM | POA: Insufficient documentation

## 2018-07-02 DIAGNOSIS — R0789 Other chest pain: Secondary | ICD-10-CM | POA: Diagnosis present

## 2018-07-02 MED ORDER — ALBUTEROL SULFATE HFA 108 (90 BASE) MCG/ACT IN AERS
1.0000 | INHALATION_SPRAY | Freq: Four times a day (QID) | RESPIRATORY_TRACT | Status: DC | PRN
  Administered 2018-07-02: 1 via RESPIRATORY_TRACT
  Filled 2018-07-02: qty 6.7

## 2018-07-02 MED ORDER — LORATADINE 10 MG PO TABS: 10.0000 mg | ORAL_TABLET | Freq: Every day | ORAL | 0 refills | Status: DC

## 2018-07-02 MED ORDER — IPRATROPIUM-ALBUTEROL 0.5-2.5 (3) MG/3ML IN SOLN
3.0000 mL | Freq: Once | RESPIRATORY_TRACT | Status: AC
  Administered 2018-07-02: 3 mL via RESPIRATORY_TRACT
  Filled 2018-07-02: qty 3

## 2018-07-02 MED ORDER — ALBUTEROL SULFATE HFA 108 (90 BASE) MCG/ACT IN AERS: 1.0000 | INHALATION_SPRAY | Freq: Four times a day (QID) | RESPIRATORY_TRACT | 2 refills | Status: DC | PRN

## 2018-07-02 NOTE — ED Notes (Signed)
Patient verbalizes understanding of discharge instructions. Opportunity for questioning and answers were provided. Armband removed by staff, pt discharged from ED.  

## 2018-07-02 NOTE — Discharge Instructions (Addendum)
Please read attached information. If you experience any new or worsening signs or symptoms please return to the emergency room for evaluation. Please follow-up with your primary care provider or specialist as discussed. Please use medication prescribed only as directed and discontinue taking if you have any concerning signs or symptoms.   °

## 2018-07-02 NOTE — ED Triage Notes (Signed)
Pt arrives to ED from home with complaints of an asthma attack that happened this morning. Pt reports that he is here to get an inhaler. Pt placed in position of comfort with bed locked and lowered, call bell in reach.

## 2018-07-02 NOTE — ED Provider Notes (Signed)
[facility] EMERGENCY DEPARTMENT Provider Note   CSN: [id number] Arrival date & time: 07/02/18  1343     History   Chief Complaint Chief Complaint  Patient presents with  . Asthma    HPI Gavin Riley is a 23 y.o. male.  HPI   23 year old male presents today with complaints of asthma exacerbation.  Patient notes a significant past medical history of asthma.  He notes that he woke up this morning with tightness in his chest typical of previous.  He notes that he has run out of his albuterol.  He denies any productive cough or fever or any respiratory symptoms.  Patient is uncertain what triggered his asthma today.  Patient denies any other complaints.  He notes symptoms have improved from this morning.  Past Medical History:  Diagnosis Date  . Asthma   . Eczema     There are no active problems to display for this patient.   History reviewed. No pertinent surgical history.     Home Medications    Prior to Admission medications   Medication Sig Start Date End Date Taking? Authorizing Provider  albuterol (PROVENTIL HFA;VENTOLIN HFA) 108 (90 Base) MCG/ACT inhaler Inhale 1-2 puffs into the lungs every 6 (six) hours as needed for wheezing or shortness of breath. 07/02/18   Ryosuke Ericksen, Tinnie Gens, PA-C  budesonide-formoterol (SYMBICORT) 80-4.5 MCG/ACT inhaler Inhale 2 puffs into the lungs every morning. 06/12/17 07/12/17  Long, Arlyss Repress, MD  loratadine (CLARITIN) 10 MG tablet Take 1 tablet (10 mg total) by mouth daily. 07/02/18   Darleen Moffitt, Tinnie Gens, PA-C  naproxen (NAPROSYN) 500 MG tablet Take 1 tablet (500 mg total) by mouth 2 (two) times daily. 05/22/18   Sabas Sous, MD  triamcinolone cream (KENALOG) 0.1 % Apply 1 application topically 2 (two) times daily. 01/14/18   Cristina Gong, PA-C    Family History History reviewed. No pertinent family history.  Social History Social History   Tobacco Use  . Smoking status: Current Every Day Smoker   Packs/day: 0.50    Types: Cigarettes  . Smokeless tobacco: Never Used  Substance Use Topics  . Alcohol use: No  . Drug use: No     Allergies   Shrimp [shellfish allergy]   Review of Systems Review of Systems  All other systems reviewed and are negative.   Physical Exam Updated Vital Signs BP 138/90 (BP Location: Right Arm)   Pulse 68   Temp 98.3 F (36.8 C) (Oral)   Resp 20   Ht 6' (1.829 m)   Wt 93 kg   SpO2 100%   BMI 27.80 kg/m   Physical Exam Vitals signs and nursing note reviewed.  Constitutional:      Appearance: He is well-developed.  HENT:     Head: Normocephalic and atraumatic.  Eyes:     General: No scleral icterus.       Right eye: No discharge.        Left eye: No discharge.     Conjunctiva/sclera: Conjunctivae normal.     Pupils: Pupils are equal, round, and reactive to light.  Neck:     Musculoskeletal: Normal range of motion.     Vascular: No JVD.     Trachea: No tracheal deviation.  Pulmonary:     Effort: Pulmonary effort is normal. No respiratory distress.     Breath sounds: Normal breath sounds. No stridor. No wheezing, rhonchi or rales.  Chest:     Chest wall: No tenderness.  Neurological:  Mental Status: He is alert and oriented to person, place, and time.     Coordination: Coordination normal.  Psychiatric:        Behavior: Behavior normal.        Thought Content: Thought content normal.        Judgment: Judgment normal.     ED Treatments / Results  Labs (all labs ordered are listed, but only abnormal results are displayed) Labs Reviewed - No data to display  EKG None  Radiology No results found.  Procedures Procedures (including critical care time)  Medications Ordered in ED Medications  albuterol (PROVENTIL HFA;VENTOLIN HFA) 108 (90 Base) MCG/ACT inhaler 1-2 puff (has no administration in time range)  ipratropium-albuterol (DUONEB) 0.5-2.5 (3) MG/3ML nebulizer solution 3 mL (3 mLs Nebulization Given 07/02/18 1538)      Initial Impression / Assessment and Plan / ED Course  I have reviewed the triage vital signs and the nursing notes.  Pertinent labs & imaging results that were available during my care of the patient were reviewed by me and considered in my medical decision making (see chart for details).       Discharge Meds: Albuterol, Claritin  Assessment/Plan: Patient presents with asthma exacerbation.  Treated with albuterol, discharged with symptomatic care instructions strict return precautions.  He verbalized understanding and agreement to today's plan.   Final Clinical Impressions(s) / ED Diagnoses   Final diagnoses:  Mild asthma with exacerbation, unspecified whether persistent    ED Discharge Orders         Ordered    loratadine (CLARITIN) 10 MG tablet  Daily     07/02/18 1530    albuterol (PROVENTIL HFA;VENTOLIN HFA) 108 (90 Base) MCG/ACT inhaler  Every 6 hours PRN     07/02/18 1530           Daffney Greenly, Josie Dixon 07/02/18 1549    Doug Sou, MD 07/02/18 1956

## 2019-05-16 ENCOUNTER — Emergency Department (HOSPITAL_COMMUNITY)
Admission: EM | Admit: 2019-05-16 | Discharge: 2019-05-16 | Disposition: A | Discharge status: Home / Self Care | Payer: Medicaid Other | Attending: Emergency Medicine | Admitting: Emergency Medicine

## 2019-05-16 ENCOUNTER — Other Ambulatory Visit: Payer: Self-pay

## 2019-05-16 DIAGNOSIS — R0602 Shortness of breath: Secondary | ICD-10-CM | POA: Diagnosis present

## 2019-05-16 DIAGNOSIS — Z79899 Other long term (current) drug therapy: Secondary | ICD-10-CM | POA: Diagnosis not present

## 2019-05-16 DIAGNOSIS — J4521 Mild intermittent asthma with (acute) exacerbation: Secondary | ICD-10-CM | POA: Diagnosis not present

## 2019-05-16 DIAGNOSIS — F17210 Nicotine dependence, cigarettes, uncomplicated: Secondary | ICD-10-CM | POA: Insufficient documentation

## 2019-05-16 MED ORDER — PREDNISONE 20 MG PO TABS: ORAL_TABLET | ORAL | 0 refills | Status: DC

## 2019-05-16 MED ORDER — ALBUTEROL SULFATE HFA 108 (90 BASE) MCG/ACT IN AERS
2.0000 | INHALATION_SPRAY | RESPIRATORY_TRACT | Status: DC | PRN
  Administered 2019-05-16: 19:00:00 2 via RESPIRATORY_TRACT
  Filled 2019-05-16: qty 6.7

## 2019-05-16 NOTE — ED Provider Notes (Signed)
[facility]-EMERGENCY DEPT Provider Note   CSN: [id number] Arrival date & time: 05/16/19  1843     History   Chief Complaint Chief Complaint  Patient presents with   Asthma    HPI Gavin Riley is a 23 y.o. male.     Patient is a 23 year old male who presents with an asthma exacerbation.  He has history of asthma and says that he has frequent attacks.  He uses albuterol at home but has run out.  He denies taking any preventative medications.  He states that he previously was on Qvar but states that it did not really work and he was taken off of it.  He started having shortness of breath earlier this morning and has been progressive throughout the day.  He came in by EMS who gave him an albuterol and Atrovent nebulizer treatment as well as 125 mg of Solu-Medrol.  He currently feels back to baseline and denies any shortness of breath.  He has had a nonproductive cough.  No fevers.  No leg swelling.  No chest pain.     Past Medical History:  Diagnosis Date   Asthma    Eczema     There are no active problems to display for this patient.   No past surgical history on file.      Home Medications    Prior to Admission medications   Medication Sig Start Date End Date Taking? Authorizing Provider  albuterol (PROVENTIL HFA;VENTOLIN HFA) 108 (90 Base) MCG/ACT inhaler Inhale 1-2 puffs into the lungs every 6 (six) hours as needed for wheezing or shortness of breath. 07/02/18   Hedges, Tinnie Gens, PA-C  budesonide-formoterol (SYMBICORT) 80-4.5 MCG/ACT inhaler Inhale 2 puffs into the lungs every morning. 06/12/17 07/12/17  Long, Arlyss Repress, MD  loratadine (CLARITIN) 10 MG tablet Take 1 tablet (10 mg total) by mouth daily. 07/02/18   Hedges, Tinnie Gens, PA-C  naproxen (NAPROSYN) 500 MG tablet Take 1 tablet (500 mg total) by mouth 2 (two) times daily. 05/22/18   Sabas Sous, MD  predniSONE (DELTASONE) 20 MG tablet 2 tabs po daily x 4 days 05/16/19   Rolan Bucco,  MD  triamcinolone cream (KENALOG) 0.1 % Apply 1 application topically 2 (two) times daily. 01/14/18   Cristina Gong, PA-C    Family History No family history on file.  Social History Social History   Tobacco Use   Smoking status: Current Every Day Smoker    Packs/day: 0.50    Types: Cigarettes   Smokeless tobacco: Never Used  Substance Use Topics   Alcohol use: No   Drug use: No     Allergies   Shrimp [shellfish allergy]   Review of Systems Review of Systems  Constitutional: Negative for chills, diaphoresis, fatigue and fever.  HENT: Negative for congestion, rhinorrhea and sneezing.   Eyes: Negative.   Respiratory: Positive for cough, shortness of breath and wheezing. Negative for chest tightness.   Cardiovascular: Negative for chest pain and leg swelling.  Gastrointestinal: Negative for abdominal pain, blood in stool, diarrhea, nausea and vomiting.  Genitourinary: Negative for difficulty urinating, flank pain, frequency and hematuria.  Musculoskeletal: Negative for arthralgias and back pain.  Skin: Negative for rash.  Neurological: Negative for dizziness, speech difficulty, weakness, numbness and headaches.     Physical Exam Updated Vital Signs BP (!) 131/96 (BP Location: Right Arm)    Pulse 71    Temp 98.8 F (37.1 C) (Oral)    Resp 12  SpO2 98%   Physical Exam Constitutional:      Appearance: He is well-developed.  HENT:     Head: Normocephalic and atraumatic.  Eyes:     Pupils: Pupils are equal, round, and reactive to light.  Neck:     Musculoskeletal: Normal range of motion and neck supple.  Cardiovascular:     Rate and Rhythm: Normal rate and regular rhythm.     Heart sounds: Normal heart sounds.  Pulmonary:     Effort: Pulmonary effort is normal. No respiratory distress.     Breath sounds: Wheezing (Few scattered wheezes) present. No rales.     Comments: No accessory muscle use or increased work of breathing Chest:     Chest wall: No  tenderness.  Abdominal:     General: Bowel sounds are normal.     Palpations: Abdomen is soft.     Tenderness: There is no abdominal tenderness. There is no guarding or rebound.  Musculoskeletal: Normal range of motion.        General: No swelling or tenderness.     Comments: No swelling or calf tenderness  Lymphadenopathy:     Cervical: No cervical adenopathy.  Skin:    General: Skin is warm and dry.     Findings: No rash.  Neurological:     Mental Status: He is alert and oriented to person, place, and time.      ED Treatments / Results  Labs (all labs ordered are listed, but only abnormal results are displayed) Labs Reviewed - No data to display  EKG None  Radiology No results found.  Procedures Procedures (including critical care time)  Medications Ordered in ED Medications  albuterol (VENTOLIN HFA) 108 (90 Base) MCG/ACT inhaler 2 puff (has no administration in time range)     Initial Impression / Assessment and Plan / ED Course  I have reviewed the triage vital signs and the nursing notes.  Pertinent labs & imaging results that were available during my care of the patient were reviewed by me and considered in my medical decision making (see chart for details).        Patient presents with an asthma exacerbation.  He is much better after nebulizer treatment and Solu-Medrol.  He does not have fever or other indication for x-ray imaging.  He was dispensed an albuterol inhaler.  He was started on a short burst of prednisone.  He was advised that he needs to follow-up with his PCP regarding better management of his asthma.  He states that he has a PCP at [facility].  Return precautions were given.  Final Clinical Impressions(s) / ED Diagnoses   Final diagnoses:  Mild intermittent asthma with exacerbation    ED Discharge Orders         Ordered    predniSONE (DELTASONE) 20 MG tablet     05/16/19 1912           Malvin Johns, MD 05/16/19 [phone]

## 2019-05-16 NOTE — ED Notes (Signed)
An After Visit Summary was printed and given to the patient. Discharge instructions given and no further questions at this time. Pt denies SOB at this time, states "I feel much better".

## 2019-05-16 NOTE — ED Triage Notes (Signed)
Pt BIB EMS from home. Pt c/o SOB with asthma exacerbation. Pt states his albuterol inhaler had run out. EMS reports labored breathing with wheezing. Pt denies cough, fever, chest pain. EMS gave 5 mg albuterol neb and 125 mg of solumedrol. Pt states relief after treatment and able to converse with EMS with no trouble afterwards.   18G L AC

## 2019-07-26 ENCOUNTER — Emergency Department (HOSPITAL_COMMUNITY)
Admission: EM | Admit: 2019-07-26 | Discharge: 2019-07-26 | Disposition: A | Discharge status: Home / Self Care | Payer: Medicaid Other | Attending: Emergency Medicine | Admitting: Emergency Medicine

## 2019-07-26 ENCOUNTER — Other Ambulatory Visit: Payer: Self-pay

## 2019-07-26 DIAGNOSIS — F17210 Nicotine dependence, cigarettes, uncomplicated: Secondary | ICD-10-CM | POA: Diagnosis not present

## 2019-07-26 DIAGNOSIS — R0602 Shortness of breath: Secondary | ICD-10-CM | POA: Diagnosis present

## 2019-07-26 DIAGNOSIS — J45909 Unspecified asthma, uncomplicated: Secondary | ICD-10-CM | POA: Diagnosis not present

## 2019-07-26 DIAGNOSIS — Z79899 Other long term (current) drug therapy: Secondary | ICD-10-CM | POA: Insufficient documentation

## 2019-07-26 DIAGNOSIS — Z760 Encounter for issue of repeat prescription: Secondary | ICD-10-CM | POA: Insufficient documentation

## 2019-07-26 MED ORDER — ALBUTEROL SULFATE HFA 108 (90 BASE) MCG/ACT IN AERS
2.0000 | INHALATION_SPRAY | Freq: Once | RESPIRATORY_TRACT | Status: AC
  Administered 2019-07-26: 2 via RESPIRATORY_TRACT
  Filled 2019-07-26: qty 6.7

## 2019-07-26 MED ORDER — AEROCHAMBER PLUS FLO-VU LARGE MISC
1.0000 | Freq: Once | Status: AC
  Administered 2019-07-26: 1

## 2019-07-26 MED ORDER — ALBUTEROL SULFATE HFA 108 (90 BASE) MCG/ACT IN AERS: 1.0000 | INHALATION_SPRAY | Freq: Four times a day (QID) | RESPIRATORY_TRACT | 0 refills | Status: DC | PRN

## 2019-07-26 MED ORDER — AEROCHAMBER PLUS FLO-VU LARGE MISC
Status: AC
  Filled 2019-07-26: qty 1

## 2019-07-26 NOTE — ED Triage Notes (Signed)
Pt here from home requesting an inhaler that he is out of , pt called his regular MD but they would not see him , no distress in triage

## 2019-07-26 NOTE — ED Provider Notes (Signed)
[facility] EMERGENCY DEPARTMENT Provider Note   CSN: [id number] Arrival date & time: 07/26/19  1119     History No chief complaint on file.   Gavin Riley is a 24 y.o. male with a past medical history significant for asthma and eczema who presents to the ED requesting a prescription for an inhaler.  Patient states he called his PCP which was unable to see him and refill his prescription.  Patient notes he started to develop mild shortness of breath last night and into this morning.  Patient denies chest pain, but admits to mild chest tightness associated with the shortness of breath.  Patient denies history of blood clots, recent surgeries, recent long immobilizations, and hormonal treatment.  Patient notes he ran out of his inhaler because he has been using intermittently and hasn't refilled the prescription in some time. Denies using it more frequently over the past few days. Patient denies hospitalizations and intubations for his asthma.  Patient denies fever and chills.  Patient denies recent illness.  Patient denies all other complaints.    Past Medical History:  Diagnosis Date  . Asthma   . Eczema     There are no problems to display for this patient.   No past surgical history on file.     No family history on file.  Social History   Tobacco Use  . Smoking status: Current Every Day Smoker    Packs/day: 0.50    Types: Cigarettes  . Smokeless tobacco: Never Used  Substance Use Topics  . Alcohol use: No  . Drug use: No    Home Medications Prior to Admission medications   Medication Sig Start Date End Date Taking? Authorizing Provider  albuterol (PROVENTIL HFA;VENTOLIN HFA) 108 (90 Base) MCG/ACT inhaler Inhale 1-2 puffs into the lungs every 6 (six) hours as needed for wheezing or shortness of breath. 07/02/18   Hedges, Dellis Filbert, PA-C  albuterol (VENTOLIN HFA) 108 (90 Base) MCG/ACT inhaler Inhale 1-2 puffs into the lungs every 6 (six) hours as  needed for wheezing or shortness of breath. 07/26/19   Suzy Bouchard, PA-C  budesonide-formoterol (SYMBICORT) 80-4.5 MCG/ACT inhaler Inhale 2 puffs into the lungs every morning. 06/12/17 07/12/17  Long, Wonda Olds, MD  loratadine (CLARITIN) 10 MG tablet Take 1 tablet (10 mg total) by mouth daily. 07/02/18   Hedges, Dellis Filbert, PA-C  naproxen (NAPROSYN) 500 MG tablet Take 1 tablet (500 mg total) by mouth 2 (two) times daily. 05/22/18   Maudie Flakes, MD  predniSONE (DELTASONE) 20 MG tablet 2 tabs po daily x 4 days 05/16/19   Malvin Johns, MD  triamcinolone cream (KENALOG) 0.1 % Apply 1 application topically 2 (two) times daily. 01/14/18   Lorin Glass, PA-C    Allergies    Shrimp [shellfish allergy]  Review of Systems   Review of Systems  Constitutional: Negative for chills and fever.  Respiratory: Positive for chest tightness and shortness of breath. Negative for wheezing.   Cardiovascular: Negative for chest pain and leg swelling.  Gastrointestinal: Negative for abdominal pain, diarrhea, nausea and vomiting.  All other systems reviewed and are negative.   Physical Exam Updated Vital Signs BP 129/86 (BP Location: Left Arm)   Pulse 69   Temp 98.1 F (36.7 C)   Resp 12   Ht 5\' 8"  (1.727 m)   Wt 90.7 kg   SpO2 95%   BMI 30.41 kg/m   Physical Exam Vitals and nursing note reviewed.  Constitutional:  General: He is not in acute distress.    Appearance: He is not ill-appearing.  HENT:     Head: Normocephalic.  Eyes:     Pupils: Pupils are equal, round, and reactive to light.  Cardiovascular:     Rate and Rhythm: Normal rate and regular rhythm.     Pulses: Normal pulses.     Heart sounds: Normal heart sounds. No murmur. No friction rub. No gallop.   Pulmonary:     Effort: Pulmonary effort is normal.     Breath sounds: Normal breath sounds.     Comments: Respirations equal and unlabored, patient able to speak in full sentences, lungs clear to auscultation  bilaterally. No wheeze, rales, or rhonchi.  Abdominal:     General: Abdomen is flat. There is no distension.     Palpations: Abdomen is soft.     Tenderness: There is no abdominal tenderness. There is no guarding or rebound.  Musculoskeletal:     Cervical back: Neck supple.     Comments: Able to move all 4 extremities without difficulty. No lower extremity edema. Negative homans sign bilaterally.   Skin:    General: Skin is warm and dry.  Neurological:     General: No focal deficit present.     Mental Status: He is alert.     ED Results / Procedures / Treatments   Labs (all labs ordered are listed, but only abnormal results are displayed) Labs Reviewed - No data to display  EKG None  Radiology No results found.  Procedures Procedures (including critical care time)  Medications Ordered in ED Medications  AeroChamber Plus Flo-Vu Large MISC (has no administration in time range)  albuterol (VENTOLIN HFA) 108 (90 Base) MCG/ACT inhaler 2 puff (2 puffs Inhalation Given 07/26/19 1228)  AeroChamber Plus Flo-Vu Large MISC 1 each (1 each Other Given 07/26/19 1229)    ED Course  I have reviewed the triage vital signs and the nursing notes.  Pertinent labs & imaging results that were available during my care of the patient were reviewed by me and considered in my medical decision making (see chart for details).  Clinical Course as of Jul 25 1241  Fri Jul 26, 2019  1237 Reassessed patient at bedside after albuterol treatment and he notes he breathing improved.    [CA]    Clinical Course User Index [CA] Jesusita Oka   MDM Rules/Calculators/A&P                     24 year old male presents to the ED for an albuterol prescription refill.  Patient notes he started to develop shortness of breath last night.  Patient notes he has been using his albuterol normally, but has not refilled it in some time.  Patient notes that shortness of breath  is his normal asthma symptoms.   Vitals all within normal limits.  Patient is not tachycardic or hypoxic.  Patient in no acute distress and non-ill-appearing.  Lungs clear to auscultation bilaterally with no wheeze, rales, or rhonchi.  Patient speaking in full sentences with no accessory muscle usage. PERC negative. No concern for DVT/PE. Patient given 2 puffs of albuterol here in the ED with improvement of shortness of breath.  Do not feel this is an asthma exacerbation so we will hold steroids at this time.  Suspect shortness of breath is his normal asthma symptoms.  Will discharge patient with new prescription for albuterol.  Vies patient to follow-up with PCP within the  next week to reevaluate symptoms. Strict ED precautions discussed with patient. Patient states understanding and agrees to plan. Patient discharged home in no acute distress and stable vitals  Final Clinical Impression(s) / ED Diagnoses Final diagnoses:  Mild asthma without complication, unspecified whether persistent  Prescription refill    Rx / DC Orders ED Discharge Orders         Ordered    albuterol (VENTOLIN HFA) 108 (90 Base) MCG/ACT inhaler  Every 6 hours PRN     07/26/19 1239           Jesusita Oka 07/26/19 1244    Gwyneth Sprout, MD 07/29/19 [phone]

## 2019-07-26 NOTE — Discharge Instructions (Addendum)
I have refilled your albuterol prescription. Always use a spacer with your inhaler. Follow-up with PCP within the next week to re-evaluate symptoms. Return to the ER for new or worsening symptoms.

## 2019-10-22 ENCOUNTER — Emergency Department (HOSPITAL_COMMUNITY)
Admission: EM | Admit: 2019-10-22 | Discharge: 2019-10-22 | Disposition: A | Discharge status: Home / Self Care | Payer: Medicaid Other | Attending: Emergency Medicine | Admitting: Emergency Medicine

## 2019-10-22 ENCOUNTER — Other Ambulatory Visit: Payer: Self-pay

## 2019-10-22 ENCOUNTER — Encounter (HOSPITAL_COMMUNITY): Payer: Self-pay

## 2019-10-22 DIAGNOSIS — J4520 Mild intermittent asthma, uncomplicated: Secondary | ICD-10-CM | POA: Insufficient documentation

## 2019-10-22 DIAGNOSIS — Z760 Encounter for issue of repeat prescription: Secondary | ICD-10-CM | POA: Diagnosis not present

## 2019-10-22 DIAGNOSIS — F17210 Nicotine dependence, cigarettes, uncomplicated: Secondary | ICD-10-CM | POA: Insufficient documentation

## 2019-10-22 MED ORDER — ALBUTEROL SULFATE HFA 108 (90 BASE) MCG/ACT IN AERS: 2.0000 | INHALATION_SPRAY | RESPIRATORY_TRACT | 1 refills | Status: DC | PRN

## 2019-10-22 NOTE — ED Provider Notes (Signed)
[facility]-EMERGENCY DEPT [facility] Emergency Department Provider Note MRN:  [id number]  Arrival date & time: 10/22/19     Chief Complaint   Shortness of breath History of Present Illness   Gavin Riley is a 24 y.o. year-old male with a history of asthma presenting to the ED with chief complaint of shortness of breath.  2 3 weeks of intermittent shortness of breath described more as a congestion sensation in his chest, feels very similar to prior issues with asthma.  His asthma seems to flareup during the springtime.  He has not been taking his antiallergy medications and he ran out of his inhaler recently.  He denies chest pain, no vision change, no headache, no drugs or alcohol, no leg pain or swelling.  Symptoms mild, no other exacerbating or alleviating factors.  Review of Systems  A complete 10 system review of systems was obtained and all systems are negative except as noted in the HPI and PMH.   Patient's Health History    Past Medical History:  Diagnosis Date  . Asthma   . Eczema     History reviewed. No pertinent surgical history.  No family history on file.  Social History   Socioeconomic History  . Marital status: Single    Spouse name: Not on file  . Number of children: Not on file  . Years of education: Not on file  . Highest education level: Not on file  Occupational History  . Not on file  Tobacco Use  . Smoking status: Current Every Day Smoker    Packs/day: 0.50    Types: Cigarettes  . Smokeless tobacco: Never Used  Substance and Sexual Activity  . Alcohol use: No  . Drug use: No  . Sexual activity: Not on file  Other Topics Concern  . Not on file  Social History Narrative  . Not on file   Social Determinants of Health   Financial Resource Strain:   . Difficulty of Paying Living Expenses:   Food Insecurity:   . Worried About Programme researcher, broadcasting/film/video in the Last Year:   . Barista in the Last Year:   Transportation Needs:   . Automotive engineer (Medical):   Marland Kitchen Lack of Transportation (Non-Medical):   Physical Activity:   . Days of Exercise per Week:   . Minutes of Exercise per Session:   Stress:   . Feeling of Stress :   Social Connections:   . Frequency of Communication with Friends and Family:   . Frequency of Social Gatherings with Friends and Family:   . Attends Religious Services:   . Active Member of Clubs or Organizations:   . Attends Banker Meetings:   Marland Kitchen Marital Status:   Intimate Partner Violence:   . Fear of Current or Ex-Partner:   . Emotionally Abused:   Marland Kitchen Physically Abused:   . Sexually Abused:      Physical Exam   Vitals:   10/22/19 1117  BP: (!) 141/93  Pulse: 65  Resp: 18  Temp: 98.8 F (37.1 C)  SpO2: 100%    CONSTITUTIONAL: Well-appearing, NAD NEURO:  Alert and oriented x 3, no focal deficits EYES:  eyes equal and reactive ENT/NECK:  no LAD, no JVD CARDIO: Regular rate, well-perfused, normal S1 and S2 PULM: Mild faint scattered wheeze GI/GU:  normal bowel sounds, non-distended, non-tender MSK/SPINE:  No gross deformities, no edema SKIN:  no rash, atraumatic PSYCH:  Appropriate speech and behavior  *Additional  and/or pertinent findings included in MDM below  Diagnostic and Interventional Summary    EKG Interpretation  Date/Time:    Ventricular Rate:    PR Interval:    QRS Duration:   QT Interval:    QTC Calculation:   R Axis:     Text Interpretation:        Labs Reviewed - No data to display  No orders to display    Medications - No data to display   Procedures  /  Critical Care Procedures  ED Course and Medical Decision Making  I have reviewed the triage vital signs, the nursing notes, and pertinent available records from the EMR.  Listed above are laboratory and imaging tests that I personally ordered, reviewed, and interpreted and then considered in my medical decision making (see below for details).      No increased work of breathing,  faint wheezes but good air movement, no accessory muscle use, normal vital signs, here mostly for medication refill, appropriate for outpatient management of asthma flare likely in the setting of seasonal allergies.    [name], [name]  Final Clinical Impressions(s) / ED Diagnoses     ICD-10-CM   1. Mild intermittent asthma, unspecified whether complicated  I94.85   2. Medication refill  Z76.0     ED Discharge Orders         Ordered    albuterol (VENTOLIN HFA) 108 (90 Base) MCG/ACT inhaler  Every 4 hours PRN     10/22/19 1144           Discharge Instructions Discussed with and Provided to Patient:     Discharge Instructions     You were evaluated in the Emergency Department and after careful evaluation, we did not find any emergent condition requiring admission or further testing in the hospital.  Your exam/testing today was overall reassuring.  Please use the albuterol inhaler as needed every 4-6 hours and also use your allergy medications at home.  Please return to the Emergency Department if you experience any worsening of your condition.  We encourage you to follow up with a primary care provider.  Thank you for allowing [place] to be a part of your care.       Maudie Flakes, MD 10/22/19 1146

## 2019-10-22 NOTE — ED Triage Notes (Signed)
Patient reports out of inhaler for 1 day, complains of chest tightness and congestion. NAD

## 2019-10-22 NOTE — Discharge Instructions (Signed)
You were evaluated in the Emergency Department and after careful evaluation, we did not find any emergent condition requiring admission or further testing in the hospital.  Your exam/testing today was overall reassuring.  Please use the albuterol inhaler as needed every 4-6 hours and also use your allergy medications at home.  Please return to the Emergency Department if you experience any worsening of your condition.  We encourage you to follow up with a primary care provider.  Thank you for allowing [place] to be a part of your care.

## 2019-12-23 ENCOUNTER — Emergency Department (HOSPITAL_COMMUNITY)
Admission: EM | Admit: 2019-12-23 | Discharge: 2019-12-24 | Disposition: A | Discharge status: Home / Self Care | Payer: Medicaid Other | Attending: Emergency Medicine | Admitting: Emergency Medicine

## 2019-12-23 ENCOUNTER — Other Ambulatory Visit: Payer: Self-pay

## 2019-12-23 ENCOUNTER — Encounter (HOSPITAL_COMMUNITY): Payer: Self-pay | Admitting: *Deleted

## 2019-12-23 DIAGNOSIS — R05 Cough: Secondary | ICD-10-CM | POA: Insufficient documentation

## 2019-12-23 DIAGNOSIS — Z79899 Other long term (current) drug therapy: Secondary | ICD-10-CM | POA: Insufficient documentation

## 2019-12-23 DIAGNOSIS — J45901 Unspecified asthma with (acute) exacerbation: Secondary | ICD-10-CM | POA: Insufficient documentation

## 2019-12-23 DIAGNOSIS — F17210 Nicotine dependence, cigarettes, uncomplicated: Secondary | ICD-10-CM | POA: Insufficient documentation

## 2019-12-23 DIAGNOSIS — R0602 Shortness of breath: Secondary | ICD-10-CM | POA: Diagnosis present

## 2019-12-23 MED ORDER — ALBUTEROL SULFATE HFA 108 (90 BASE) MCG/ACT IN AERS
2.0000 | INHALATION_SPRAY | Freq: Once | RESPIRATORY_TRACT | Status: AC
  Administered 2019-12-23: 2 via RESPIRATORY_TRACT
  Filled 2019-12-23: qty 6.7

## 2019-12-23 NOTE — ED Triage Notes (Signed)
Pt says his asthma is bothering him for about 3 days, has been out of his inhaler. Has had sob, cough, wheezing. Mild wheezing, no distress in triage.

## 2019-12-24 MED ORDER — ALBUTEROL SULFATE HFA 108 (90 BASE) MCG/ACT IN AERS: 8.0000 | INHALATION_SPRAY | Freq: Once | RESPIRATORY_TRACT | Status: DC

## 2019-12-24 MED ORDER — AEROCHAMBER PLUS FLO-VU LARGE MISC
1.0000 | Freq: Once | Status: AC
  Administered 2019-12-24: 1

## 2019-12-24 MED ORDER — BUDESONIDE-FORMOTEROL FUMARATE 80-4.5 MCG/ACT IN AERO: 2.0000 | INHALATION_SPRAY | Freq: Two times a day (BID) | RESPIRATORY_TRACT | 12 refills | Status: DC

## 2019-12-24 NOTE — ED Provider Notes (Signed)
[facility] EMERGENCY DEPARTMENT Provider Note   CSN: [id number] Arrival date & time: 12/23/19  2320     History Chief Complaint  Patient presents with   Asthma    Gavin Riley is a 24 y.o. male with a history of asthma and eczema who presents the emergency department with 3 days of shortness of breath, wheezing, and nonproductive cough.  Symptoms have been gradually worsening since onset.  States that his symptoms feel consistent with previous asthma exacerbations.  Symptoms are worse at night and with exertion.  No other known aggravating or alleviating factors.  He denies fever, chills, loss of sense of taste or smell, chest pain, nausea, vomiting, diarrhea, syncope, rash, leg swelling.  Symptoms were significantly improving by using his home albuterol inhaler, but he ran out of the medication earlier today and was unable to sleep due to his symptoms, which prompted his visit to the ER.  He denies any hospital admissions, intubations secondary to asthma.  No history of PE.  He has not received his COVID-19 vaccine.  He reports that he has to use his albuterol inhaler almost daily for symptom control.  He does not take any other daily medications and has not established with primary care.  The history is provided by the patient and medical records. No language interpreter was used.       Past Medical History:  Diagnosis Date   Asthma    Eczema     There are no problems to display for this patient.   History reviewed. No pertinent surgical history.     No family history on file.  Social History   Tobacco Use   Smoking status: Current Every Day Smoker    Packs/day: 0.50    Types: Cigarettes   Smokeless tobacco: Never Used  Vaping Use   Vaping Use: Never used  Substance Use Topics   Alcohol use: No   Drug use: No    Home Medications Prior to Admission medications   Medication Sig Start Date End Date Taking? Authorizing Provider    albuterol (VENTOLIN HFA) 108 (90 Base) MCG/ACT inhaler Inhale 2 puffs into the lungs every 4 (four) hours as needed for wheezing or shortness of breath. 10/22/19   Sabas Sous, MD  budesonide-formoterol (SYMBICORT) 80-4.5 MCG/ACT inhaler Inhale 2 puffs into the lungs 2 (two) times daily. 12/24/19   Bentley Haralson A, PA-C  loratadine (CLARITIN) 10 MG tablet Take 1 tablet (10 mg total) by mouth daily. 07/02/18   Hedges, Tinnie Gens, PA-C  naproxen (NAPROSYN) 500 MG tablet Take 1 tablet (500 mg total) by mouth 2 (two) times daily. 05/22/18   Sabas Sous, MD  predniSONE (DELTASONE) 20 MG tablet 2 tabs po daily x 4 days 05/16/19   Rolan Bucco, MD  triamcinolone cream (KENALOG) 0.1 % Apply 1 application topically 2 (two) times daily. 01/14/18   Cristina Gong, PA-C    Allergies    Shrimp [shellfish allergy]  Review of Systems   Review of Systems  Constitutional: Negative for appetite change and fever.  HENT: Negative for congestion and sore throat.   Respiratory: Positive for choking, shortness of breath and wheezing.   Cardiovascular: Negative for chest pain, palpitations and leg swelling.  Gastrointestinal: Negative for abdominal pain, diarrhea, nausea and vomiting.  Genitourinary: Negative for dysuria.  Musculoskeletal: Negative for back pain, myalgias, neck pain and neck stiffness.  Skin: Negative for rash.  Allergic/Immunologic: Negative for immunocompromised state.  Neurological: Negative for seizures, syncope,  weakness, numbness and headaches.  Psychiatric/Behavioral: Negative for confusion.    Physical Exam Updated Vital Signs BP 123/81    Pulse 87    Temp 98.4 F (36.9 C) (Oral)    Resp 18    SpO2 98%   Physical Exam Vitals and nursing note reviewed.  Constitutional:      General: He is not in acute distress.    Appearance: He is well-developed. He is not ill-appearing, toxic-appearing or diaphoretic.  HENT:     Head: Normocephalic.  Eyes:     Conjunctiva/sclera:  Conjunctivae normal.  Cardiovascular:     Rate and Rhythm: Normal rate and regular rhythm.     Pulses: Normal pulses.     Heart sounds: Normal heart sounds. No murmur heard.  No friction rub. No gallop.   Pulmonary:     Effort: Pulmonary effort is normal. No respiratory distress.     Breath sounds: Normal breath sounds. No stridor. No wheezing, rhonchi or rales.     Comments: Lungs are clear to auscultation bilaterally.  No adventitious breath sounds.  No increased work of breathing, including accessory muscle use, tachypnea, retractions, or nasal flaring.  No conversational dyspnea. Chest:     Chest wall: No tenderness.  Abdominal:     General: There is no distension.     Palpations: Abdomen is soft.  Musculoskeletal:     Cervical back: Neck supple.     Right lower leg: No edema.     Left lower leg: No edema.  Skin:    General: Skin is warm and dry.  Neurological:     Mental Status: He is alert.  Psychiatric:        Behavior: Behavior normal.     ED Results / Procedures / Treatments   Labs (all labs ordered are listed, but only abnormal results are displayed) Labs Reviewed - No data to display  EKG None  Radiology No results found.  Procedures Procedures (including critical care time)  Medications Ordered in ED Medications  albuterol (VENTOLIN HFA) 108 (90 Base) MCG/ACT inhaler 2 puff (2 puffs Inhalation Given 12/23/19 2329)  AeroChamber Plus Flo-Vu Large MISC 1 each (1 each Other Given 12/24/19 0029)    ED Course  I have reviewed the triage vital signs and the nursing notes.  Pertinent labs & imaging results that were available during my care of the patient were reviewed by me and considered in my medical decision making (see chart for details).    MDM Rules/Calculators/A&P                          24 year old male with history of asthma and eczema presenting with 3 days of shortness of breath, wheezing, and nonproductive cough that is worse at night.  He has  been seen multiple times in the ER over the last few years and states that his symptoms today feel similar to previous asthma exacerbations.  He ran out of his home albuterol.  Triage nurse noted that patient was wheezing in triage and he was given 2 puffs of albuterol prior to my evaluation.  On my examination, lungs are clear and he has no increased work of breathing.  Oxygen saturation is 98 to 100% on room air with good waveform.  Exacerbation today is very mild and prednisone taper is not indicated.  He does not have a spacer, which we will provide with him in the ER to increase effectiveness of albuterol.  It sounds  as if he has also been using his home inhaler almost daily.  I think that he would benefit from a maintenance ICH and LABA.  He did receive a prescription of Symbicort in the ER more than 3 years ago, but does not recall if he took the medication.  Will start him on a low dose of Symbicort, but patient has been advised that he will need to get established with primary care for follow-up of his symptoms.  I have a low suspicion for ACS, PE, COVID-19, or CAP at this time.  He is hemodynamically stable and in no acute distress.  Safe for discharge to home with outpatient follow-up as indicated.  Final Clinical Impression(s) / ED Diagnoses Final diagnoses:  Asthma with acute exacerbation, unspecified asthma severity, unspecified whether persistent    Rx / DC Orders ED Discharge Orders         Ordered    budesonide-formoterol (SYMBICORT) 80-4.5 MCG/ACT inhaler  2 times daily     Discontinue  Reprint     12/24/19 0024           Barkley Boards, PA-C 12/24/19 0034    Palumbo, April, MD 12/24/19 [id number]

## 2019-12-24 NOTE — Discharge Instructions (Signed)
Thank you for allowing me to care for you today in the Emergency Department.   Use 2-4 puffs of the albuterol inhaler every 4 to 6 hours as needed for shortness of breath, wheezing.  Albuterol is a rescue inhaler.  I have also called you in an inhaler called Symbicort.  Symbicort is a maintenance inhaler.  As we discussed, you should use 2 puffs of this medication every morning and every night to reduce how often you may need to use your rescue inhaler.  This medication may need to be adjusted by primary care.  If you do not have a primary care clinician, you can call the number on your discharge paperwork to get established with one (page 11).    Please note, Symbicort is designed to reduce the frequency of how often you are having asthma attacks.  However, if you are actively having wheezing, shortness of breath, difficulty breathing, you should only use your albuterol inhaler.  Return to the emergency department if you develop respiratory distress, if your fingers or lips turn blue, if you pass out, or develop other new, concerning symptoms.

## 2019-12-24 NOTE — ED Notes (Signed)
Verbalized understanding of DC instructions, Rx, follow up care 

## 2020-01-31 ENCOUNTER — Ambulatory Visit: Payer: Medicaid Other | Admitting: Family Medicine

## 2020-03-21 ENCOUNTER — Other Ambulatory Visit: Payer: Self-pay

## 2020-03-21 ENCOUNTER — Encounter: Payer: Self-pay | Admitting: Emergency Medicine

## 2020-03-21 ENCOUNTER — Ambulatory Visit
Admission: EM | Admit: 2020-03-21 | Discharge: 2020-03-21 | Disposition: A | Discharge status: Home / Self Care | Payer: Medicaid Other | Attending: Family Medicine | Admitting: Family Medicine

## 2020-03-21 DIAGNOSIS — J4521 Mild intermittent asthma with (acute) exacerbation: Secondary | ICD-10-CM | POA: Diagnosis not present

## 2020-03-21 DIAGNOSIS — J209 Acute bronchitis, unspecified: Secondary | ICD-10-CM

## 2020-03-21 MED ORDER — ALBUTEROL SULFATE HFA 108 (90 BASE) MCG/ACT IN AERS: 2.0000 | INHALATION_SPRAY | RESPIRATORY_TRACT | 1 refills | Status: DC | PRN

## 2020-03-21 MED ORDER — AEROCHAMBER PLUS FLO-VU MEDIUM MISC
1.0000 | Freq: Once | Status: AC
  Administered 2020-03-21: 1

## 2020-03-21 MED ORDER — PREDNISONE 20 MG PO TABS: 40.0000 mg | ORAL_TABLET | Freq: Every day | ORAL | 0 refills | Status: AC

## 2020-03-21 MED ORDER — ALBUTEROL SULFATE HFA 108 (90 BASE) MCG/ACT IN AERS
2.0000 | INHALATION_SPRAY | RESPIRATORY_TRACT | Status: DC | PRN
  Administered 2020-03-21: 2 via RESPIRATORY_TRACT

## 2020-03-21 MED ORDER — AMOXICILLIN-POT CLAVULANATE 875-125 MG PO TABS: 1.0000 | ORAL_TABLET | Freq: Two times a day (BID) | ORAL | 0 refills | Status: DC

## 2020-03-21 MED ORDER — METHYLPREDNISOLONE SODIUM SUCC 125 MG IJ SOLR
125.0000 mg | Freq: Once | INTRAMUSCULAR | Status: AC
  Administered 2020-03-21: 125 mg via INTRAMUSCULAR

## 2020-03-21 MED ORDER — BUDESONIDE-FORMOTEROL FUMARATE 80-4.5 MCG/ACT IN AERO: 2.0000 | INHALATION_SPRAY | Freq: Two times a day (BID) | RESPIRATORY_TRACT | 0 refills | Status: DC

## 2020-03-21 NOTE — ED Provider Notes (Signed)
[facility]    CSN: [id number] Arrival date & time: 03/21/20  0917      History   Chief Complaint Chief Complaint  Patient presents with  . Asthma    HPI Osker YOVAN LEEMAN is a 24 y.o. male.   HPI   Patient presents today with an acute asthma exacerbation with audible wheezing.  Patient reports he has been without his albuterol inhaler for several months.  He is also an active daily smoker.  He is without primary care.  He seeks most of his care at the ER or urgent cares.  Endorses some coughing.  Afebrile.  Endorses some chest tightness and increased work of breathing. Past Medical History:  Diagnosis Date  . Asthma   . Eczema     There are no problems to display for this patient.   History reviewed. No pertinent surgical history.     Home Medications    Prior to Admission medications   Medication Sig Start Date End Date Taking? Authorizing Provider  albuterol (VENTOLIN HFA) 108 (90 Base) MCG/ACT inhaler Inhale 2 puffs into the lungs every 4 (four) hours as needed for wheezing or shortness of breath. 10/22/19   Sabas Sous, MD  budesonide-formoterol (SYMBICORT) 80-4.5 MCG/ACT inhaler Inhale 2 puffs into the lungs 2 (two) times daily. 12/24/19   McDonald, Mia A, PA-C  loratadine (CLARITIN) 10 MG tablet Take 1 tablet (10 mg total) by mouth daily. 07/02/18   Hedges, Tinnie Gens, PA-C  naproxen (NAPROSYN) 500 MG tablet Take 1 tablet (500 mg total) by mouth 2 (two) times daily. 05/22/18   Sabas Sous, MD  predniSONE (DELTASONE) 20 MG tablet 2 tabs po daily x 4 days Patient not taking: Reported on 03/21/2020 05/16/19   Rolan Bucco, MD  triamcinolone cream (KENALOG) 0.1 % Apply 1 application topically 2 (two) times daily. 01/14/18   Cristina Gong, PA-C    Family History Family History  Family history unknown: Yes    Social History Social History   Tobacco Use  . Smoking status: Current Every Day Smoker    Packs/day: 0.50    Types: Cigarettes    . Smokeless tobacco: Never Used  Vaping Use  . Vaping Use: Never used  Substance Use Topics  . Alcohol use: No  . Drug use: No     Allergies   Shrimp [shellfish allergy] Review of Systems Review of Systems Pertinent negatives listed in HPI Physical Exam  Triage Vital Signs ED Triage Vitals  Enc Vitals Group     BP 03/21/20 0922 (!) 156/85     Pulse Rate 03/21/20 0922 65     Resp 03/21/20 0922 18     Temp 03/21/20 0922 98.1 F (36.7 C)     Temp Source 03/21/20 0922 Oral     SpO2 03/21/20 0922 96 %     Weight --      Height --      Head Circumference --      Peak Flow --      Pain Score 03/21/20 0941 3     Pain Loc --      Pain Edu? --      Excl. in GC? --    No data found.  Updated Vital Signs BP (!) 156/85 (BP Location: Left Arm)   Pulse 65   Temp 98.1 F (36.7 C) (Oral)   Resp 18   SpO2 96%   Visual Acuity Right Eye Distance:   Left Eye Distance:  Bilateral Distance:    Right Eye Near:   Left Eye Near:    Bilateral Near:     Physical Exam HENT:     Head: Normocephalic.     Nose: Nose normal.  Cardiovascular:     Rate and Rhythm: Normal rate and regular rhythm.  Pulmonary:     Breath sounds: Wheezing and rhonchi present.  Chest:     Chest wall: Tenderness present.  Musculoskeletal:     Cervical back: Normal range of motion.  Skin:    General: Skin is warm.     Capillary Refill: Capillary refill takes less than 2 seconds.  Neurological:     General: No focal deficit present.     Mental Status: He is oriented to person, place, and time.  Psychiatric:        Mood and Affect: Mood normal.        Thought Content: Thought content normal.        Cognition and Memory: Cognition normal.    UC Treatments / Results  Labs (all labs ordered are listed, but only abnormal results are displayed) Labs Reviewed - No data to display  EKG   Radiology No results found.  Procedures Procedures (including critical care time)  Medications Ordered  in UC Medications  albuterol (VENTOLIN HFA) 108 (90 Base) MCG/ACT inhaler 2 puff (2 puffs Inhalation Given 03/21/20 1028)  AeroChamber Plus Flo-Vu Medium MISC 1 each (1 each Other Given 03/21/20 1028)  methylPREDNISolone sodium succinate (SOLU-MEDROL) 125 mg/2 mL injection 125 mg (125 mg Intramuscular Given 03/21/20 1028)    Initial Impression / Assessment and Plan / UC Course  I have reviewed the triage vital signs and the nursing notes.  Pertinent labs & imaging results that were available during my care of the patient were reviewed by me and considered in my medical decision making (see chart for details).     Treating for acute asthma exacerbation along with acute bronchitis.  Patient given 2 puffs of albuterol here in clinic wheezing improved significantly.  Patient was also treated with Solu-Medrol 125 mill milligrams.  Reported significant increased work of breathing.  Also treating for acute bronchitis given mild rhonchi noted during exam.  Advised to start prednisone tomorrow and take as directed.  Refilled patient's albuterol along with Symbicort advised to resume.  Red flags discussed.  Patient stable for discharge.  Final Clinical Impressions(s) / UC Diagnoses   Final diagnoses:  Intermittent asthma with acute exacerbation, unspecified asthma severity  Acute bronchitis, unspecified organism     Discharge Instructions          ED Prescriptions    Medication Sig Dispense Auth. Provider   albuterol (VENTOLIN HFA) 108 (90 Base) MCG/ACT inhaler Inhale 2 puffs into the lungs every 4 (four) hours as needed for wheezing or shortness of breath. 18 g Bing Neighbors, FNP   budesonide-formoterol (SYMBICORT) 80-4.5 MCG/ACT inhaler Inhale 2 puffs into the lungs 2 (two) times daily. 10.2 g Bing Neighbors, FNP   amoxicillin-clavulanate (AUGMENTIN) 875-125 MG tablet Take 1 tablet by mouth 2 (two) times daily. 20 tablet Bing Neighbors, FNP   predniSONE (DELTASONE) 20 MG tablet  Take 2 tablets (40 mg total) by mouth daily with breakfast for 5 days. 10 tablet Bing Neighbors, FNP     PDMP not reviewed this encounter.   Bing Neighbors, FNP 03/21/20 1052

## 2020-03-21 NOTE — ED Triage Notes (Signed)
Pt here for increased asthma sx x several weeks since running out of his inhaler; wheezing noted

## 2020-06-02 ENCOUNTER — Emergency Department (HOSPITAL_BASED_OUTPATIENT_CLINIC_OR_DEPARTMENT_OTHER): Payer: Medicaid Other

## 2020-06-02 ENCOUNTER — Other Ambulatory Visit: Payer: Self-pay

## 2020-06-02 DIAGNOSIS — S6391XA Sprain of unspecified part of right wrist and hand, initial encounter: Secondary | ICD-10-CM | POA: Diagnosis not present

## 2020-06-02 DIAGNOSIS — Z7952 Long term (current) use of systemic steroids: Secondary | ICD-10-CM | POA: Insufficient documentation

## 2020-06-02 DIAGNOSIS — J45909 Unspecified asthma, uncomplicated: Secondary | ICD-10-CM | POA: Insufficient documentation

## 2020-06-02 DIAGNOSIS — W2209XA Striking against other stationary object, initial encounter: Secondary | ICD-10-CM | POA: Diagnosis not present

## 2020-06-02 DIAGNOSIS — F17210 Nicotine dependence, cigarettes, uncomplicated: Secondary | ICD-10-CM | POA: Insufficient documentation

## 2020-06-02 DIAGNOSIS — S6991XA Unspecified injury of right wrist, hand and finger(s), initial encounter: Secondary | ICD-10-CM | POA: Diagnosis present

## 2020-06-02 NOTE — ED Triage Notes (Signed)
Pt states he punched a wall ago and is having right hand pain.

## 2020-06-03 ENCOUNTER — Emergency Department (HOSPITAL_BASED_OUTPATIENT_CLINIC_OR_DEPARTMENT_OTHER)
Admission: EM | Admit: 2020-06-03 | Discharge: 2020-06-03 | Disposition: A | Discharge status: Home / Self Care | Payer: Medicaid Other | Attending: Emergency Medicine | Admitting: Emergency Medicine

## 2020-06-03 DIAGNOSIS — S6391XA Sprain of unspecified part of right wrist and hand, initial encounter: Secondary | ICD-10-CM

## 2020-06-03 MED ORDER — IBUPROFEN 400 MG PO TABS
400.0000 mg | ORAL_TABLET | Freq: Once | ORAL | Status: AC
  Administered 2020-06-03: 400 mg via ORAL
  Filled 2020-06-03: qty 1

## 2020-06-03 NOTE — ED Provider Notes (Signed)
[facility] EMERGENCY DEPARTMENT Provider Note   CSN: [id number] Arrival date & time: 06/02/20  2252     History Chief Complaint  Patient presents with  . Hand Injury    Gavin Riley is a 24 y.o. male.  The history is provided by the patient and a significant other.  Hand Injury Location:  Hand Hand location:  R hand Injury: yes   Mechanism of injury comment:  Punched a wall Pain details:    Quality:  Aching   Radiates to:  Does not radiate   Severity:  Moderate   Onset quality:  Sudden   Duration:  5 hours   Timing:  Constant   Progression:  Unchanged Relieved by:  Nothing Worsened by:  Movement Patient reports he became angry and punched a wall with his right hand. Denies any other complaints. Requesting x-ray to look for a fracture     Past Medical History:  Diagnosis Date  . Asthma   . Eczema     There are no problems to display for this patient.   No past surgical history on file.     Family History  Family history unknown: Yes    Social History   Tobacco Use  . Smoking status: Current Every Day Smoker    Packs/day: 0.50    Types: Cigarettes  . Smokeless tobacco: Never Used  Vaping Use  . Vaping Use: Never used  Substance Use Topics  . Alcohol use: No  . Drug use: No    Home Medications Prior to Admission medications   Medication Sig Start Date End Date Taking? Authorizing Provider  albuterol (VENTOLIN HFA) 108 (90 Base) MCG/ACT inhaler Inhale 2 puffs into the lungs every 4 (four) hours as needed for wheezing or shortness of breath. 03/21/20   Bing Neighbors, FNP  budesonide-formoterol (SYMBICORT) 80-4.5 MCG/ACT inhaler Inhale 2 puffs into the lungs 2 (two) times daily. 03/21/20   Bing Neighbors, FNP  loratadine (CLARITIN) 10 MG tablet Take 1 tablet (10 mg total) by mouth daily. 07/02/18   Hedges, Tinnie Gens, PA-C  triamcinolone cream (KENALOG) 0.1 % Apply 1 application topically 2 (two) times daily. 01/14/18   Cristina Gong, PA-C    Allergies    Shrimp [shellfish allergy]  Review of Systems   Review of Systems  Musculoskeletal: Positive for arthralgias and joint swelling.  Neurological: Negative for weakness.    Physical Exam Updated Vital Signs BP 126/83 (BP Location: Right Arm)   Pulse 94   Temp 98.1 F (36.7 C) (Oral)   Resp 16   Ht 1.829 m (6')   Wt 91 kg   SpO2 100%   BMI 27.21 kg/m   Physical Exam CONSTITUTIONAL: Well developed/well nourished HEAD: Normocephalic/atraumatic EYES: EOMI ENMT: Mask in place NECK: supple no meningeal signs LUNGS:  no apparent distress NEURO: Pt is awake/alert/appropriate, moves all extremitiesx4.  No facial droop.   EXTREMITIES: pulses normal/equal, full ROM, mild tenderness noted to dorsum of right hand. No deformities. He can fully flex and extend all fingers in the hand. He can fully flex and extend the wrist. No significant bruising. No deformities. SKIN: warm, color normal PSYCH: no abnormalities of mood noted, alert and oriented to situation  ED Results / Procedures / Treatments   Labs (all labs ordered are listed, but only abnormal results are displayed) Labs Reviewed - No data to display  EKG None  Radiology DG Hand Complete Right  Result Date: 06/02/2020 CLINICAL DATA:  Right hand  pain EXAM: RIGHT HAND - COMPLETE 3+ VIEW COMPARISON:  None. FINDINGS: There is no evidence of fracture or dislocation. There is no evidence of arthropathy or other focal bone abnormality. Soft tissues are unremarkable. IMPRESSION: Negative. Electronically Signed   By: Helyn Numbers MD   On: 06/02/2020 23:23    Procedures Procedures Medications Ordered in ED Medications  ibuprofen (ADVIL) tablet 400 mg (400 mg Oral Given 06/03/20 0136)    ED Course  I have reviewed the triage vital signs and the nursing notes.  Pertinent  imaging results that were available during my care of the patient were reviewed by me and considered in my medical decision  making (see chart for details).    MDM Rules/Calculators/A&P                          X-ray negative. Advised ibuprofen and ice. Follow-up with sports medicine as needed Final Clinical Impression(s) / ED Diagnoses Final diagnoses:  Hand sprain, right, initial encounter    Rx / DC Orders ED Discharge Orders    None       Zadie Rhine, MD 06/03/20 [phone]

## 2020-07-01 ENCOUNTER — Other Ambulatory Visit: Payer: Self-pay

## 2020-07-01 ENCOUNTER — Emergency Department (HOSPITAL_BASED_OUTPATIENT_CLINIC_OR_DEPARTMENT_OTHER)
Admission: EM | Admit: 2020-07-01 | Discharge: 2020-07-01 | Disposition: A | Discharge status: Home / Self Care | Payer: Medicaid Other | Attending: Emergency Medicine | Admitting: Emergency Medicine

## 2020-07-01 ENCOUNTER — Encounter (HOSPITAL_BASED_OUTPATIENT_CLINIC_OR_DEPARTMENT_OTHER): Payer: Self-pay | Admitting: *Deleted

## 2020-07-01 DIAGNOSIS — Z1152 Encounter for screening for COVID-19: Secondary | ICD-10-CM | POA: Insufficient documentation

## 2020-07-01 DIAGNOSIS — Z5321 Procedure and treatment not carried out due to patient leaving prior to being seen by health care provider: Secondary | ICD-10-CM | POA: Diagnosis not present

## 2020-07-01 NOTE — ED Triage Notes (Signed)
C/o covid sx x 1 day 

## 2020-07-01 NOTE — ED Notes (Signed)
Pt states he did not want to wait any longer. Pt walked out.

## 2020-09-30 ENCOUNTER — Other Ambulatory Visit: Payer: Self-pay

## 2020-09-30 ENCOUNTER — Emergency Department (HOSPITAL_BASED_OUTPATIENT_CLINIC_OR_DEPARTMENT_OTHER): Payer: Medicaid Other

## 2020-09-30 ENCOUNTER — Emergency Department (HOSPITAL_BASED_OUTPATIENT_CLINIC_OR_DEPARTMENT_OTHER)
Admission: EM | Admit: 2020-09-30 | Discharge: 2020-09-30 | Disposition: A | Discharge status: Home / Self Care | Payer: Medicaid Other | Attending: Emergency Medicine | Admitting: Emergency Medicine

## 2020-09-30 ENCOUNTER — Encounter (HOSPITAL_BASED_OUTPATIENT_CLINIC_OR_DEPARTMENT_OTHER): Payer: Self-pay | Admitting: *Deleted

## 2020-09-30 DIAGNOSIS — J45909 Unspecified asthma, uncomplicated: Secondary | ICD-10-CM | POA: Diagnosis not present

## 2020-09-30 DIAGNOSIS — Z7951 Long term (current) use of inhaled steroids: Secondary | ICD-10-CM | POA: Insufficient documentation

## 2020-09-30 DIAGNOSIS — R091 Pleurisy: Secondary | ICD-10-CM

## 2020-09-30 DIAGNOSIS — F17210 Nicotine dependence, cigarettes, uncomplicated: Secondary | ICD-10-CM | POA: Diagnosis not present

## 2020-09-30 DIAGNOSIS — R079 Chest pain, unspecified: Secondary | ICD-10-CM

## 2020-09-30 LAB — CBC
HCT: 48.5 % (ref 39.0–52.0)
Hemoglobin: 16.7 g/dL (ref 13.0–17.0)
MCH: 31.6 pg (ref 26.0–34.0)
MCHC: 34.4 g/dL (ref 30.0–36.0)
MCV: 91.9 fL (ref 80.0–100.0)
Platelets: 236 10*3/uL (ref 150–400)
RBC: 5.28 MIL/uL (ref 4.22–5.81)
RDW: 13.6 % (ref 11.5–15.5)
WBC: 6.4 10*3/uL (ref 4.0–10.5)
nRBC: 0 % (ref 0.0–0.2)

## 2020-09-30 LAB — COMPREHENSIVE METABOLIC PANEL
ALT: 16 U/L (ref 0–44)
AST: 25 U/L (ref 15–41)
Albumin: 4.2 g/dL (ref 3.5–5.0)
Alkaline Phosphatase: 60 U/L (ref 38–126)
Anion gap: 11 (ref 5–15)
BUN: 15 mg/dL (ref 6–20)
CO2: 24 mmol/L (ref 22–32)
Calcium: 9.2 mg/dL (ref 8.9–10.3)
Chloride: 104 mmol/L (ref 98–111)
Creatinine, Ser: 1.17 mg/dL (ref 0.61–1.24)
GFR, Estimated: >60 mL/min (ref >60)
Glucose, Bld: 80 mg/dL (ref 70–99)
Potassium: 3.9 mmol/L (ref 3.5–5.1)
Sodium: 139 mmol/L (ref 135–145)
Total Bilirubin: 0.7 mg/dL (ref 0.3–1.2)
Total Protein: 7.4 g/dL (ref 6.5–8.1)

## 2020-09-30 LAB — D-DIMER, QUANTITATIVE: D-Dimer, Quant: 0.31 ug/mL-FEU (ref 0.00–0.50)

## 2020-09-30 LAB — TROPONIN I (HIGH SENSITIVITY): Troponin I (High Sensitivity): 4 ng/L (ref <18)

## 2020-09-30 MED ORDER — IPRATROPIUM BROMIDE HFA 17 MCG/ACT IN AERS
2.0000 | INHALATION_SPRAY | Freq: Once | RESPIRATORY_TRACT | Status: DC
  Filled 2020-09-30: qty 12.9

## 2020-09-30 MED ORDER — KETOROLAC TROMETHAMINE 15 MG/ML IJ SOLN
15.0000 mg | Freq: Once | INTRAMUSCULAR | Status: AC
  Administered 2020-09-30: 15 mg via INTRAVENOUS
  Filled 2020-09-30: qty 1

## 2020-09-30 MED ORDER — ALBUTEROL SULFATE HFA 108 (90 BASE) MCG/ACT IN AERS
6.0000 | INHALATION_SPRAY | Freq: Once | RESPIRATORY_TRACT | Status: DC
  Filled 2020-09-30: qty 6.7

## 2020-09-30 NOTE — ED Provider Notes (Signed)
[facility] EMERGENCY DEPARTMENT Provider Note  CSN: [id number] Arrival date & time: 09/30/20 0005  Chief Complaint(s) Chest Pain  HPI Gavin Riley is a 25 y.o. male   The history is provided by the patient.  Chest Pain Pain location:  L chest Pain quality: sharp and stabbing   Pain radiates to:  Does not radiate Pain severity:  Moderate Onset quality:  Sudden Timing:  Intermittent Context: breathing   Relieved by:  Nothing Worsened by:  Deep breathing Associated symptoms: shortness of breath   Associated symptoms: no anxiety, no dysphagia, no fatigue, no fever, no headache and no heartburn     Past Medical History Past Medical History:  Diagnosis Date  . Asthma   . Eczema    There are no problems to display for this patient.  Home Medication(s) Prior to Admission medications   Medication Sig Start Date End Date Taking? Authorizing Provider  albuterol (VENTOLIN HFA) 108 (90 Base) MCG/ACT inhaler Inhale 2 puffs into the lungs every 4 (four) hours as needed for wheezing or shortness of breath. 03/21/20   Bing Neighbors, FNP  budesonide-formoterol (SYMBICORT) 80-4.5 MCG/ACT inhaler Inhale 2 puffs into the lungs 2 (two) times daily. 03/21/20   Bing Neighbors, FNP  loratadine (CLARITIN) 10 MG tablet Take 1 tablet (10 mg total) by mouth daily. 07/02/18   Hedges, Tinnie Gens, PA-C  triamcinolone cream (KENALOG) 0.1 % Apply 1 application topically 2 (two) times daily. 01/14/18   Cristina Gong, PA-C                                                                                                                                    Past Surgical History History reviewed. No pertinent surgical history. Family History Family History  Family history unknown: Yes    Social History Social History   Tobacco Use  . Smoking status: Current Every Day Smoker    Packs/day: 0.50    Types: Cigarettes  . Smokeless tobacco: Never Used  Vaping Use  . Vaping Use:  Never used  Substance Use Topics  . Alcohol use: No  . Drug use: No   Allergies Shrimp [shellfish allergy]  Review of Systems Review of Systems  Constitutional: Negative for fatigue and fever.  HENT: Negative for trouble swallowing.   Respiratory: Positive for shortness of breath.   Cardiovascular: Positive for chest pain.  Gastrointestinal: Negative for heartburn.  Neurological: Negative for headaches.   All other systems are reviewed and are negative for acute change except as noted in the HPI  Physical Exam Vital Signs  I have reviewed the triage vital signs BP 125/77   Pulse 85   Temp 98.2 F (36.8 C) (Oral)   Resp (!) 21   Ht 5\' 11"  (1.803 m)   Wt 81.6 kg   SpO2 96%   BMI 25.10 kg/m   Physical Exam Vitals reviewed.  Constitutional:  General: He is not in acute distress.    Appearance: He is well-developed. He is not diaphoretic.  HENT:     Head: Normocephalic and atraumatic.     Nose: Nose normal.  Eyes:     General: No scleral icterus.       Right eye: No discharge.        Left eye: No discharge.     Conjunctiva/sclera: Conjunctivae normal.     Pupils: Pupils are equal, round, and reactive to light.  Cardiovascular:     Rate and Rhythm: Normal rate and regular rhythm.     Heart sounds: No murmur heard. No friction rub. No gallop.   Pulmonary:     Effort: Pulmonary effort is normal. No respiratory distress.     Breath sounds: No stridor. Examination of the left-upper field reveals wheezing. Examination of the left-middle field reveals wheezing. Wheezing present. No rhonchi or rales.  Chest:     Chest wall: No tenderness.  Abdominal:     General: There is no distension.     Palpations: Abdomen is soft.     Tenderness: There is no abdominal tenderness.  Musculoskeletal:        General: No tenderness.     Cervical back: Normal range of motion and neck supple.  Skin:    General: Skin is warm and dry.     Findings: No erythema or rash.   Neurological:     Mental Status: He is alert and oriented to person, place, and time.     ED Results and Treatments Labs (all labs ordered are listed, but only abnormal results are displayed) Labs Reviewed  CBC  COMPREHENSIVE METABOLIC PANEL  D-DIMER, QUANTITATIVE  TROPONIN I (HIGH SENSITIVITY)                                                                                                                         EKG  EKG Interpretation  Date/Time:  Wednesday September 30 2020 00:19:40 EDT Ventricular Rate:  87 PR Interval:  148 QRS Duration: 90 QT Interval:  370 QTC Calculation: 445 R Axis:   110 Text Interpretation: Normal sinus rhythm Right axis deviation Abnormal ECG No old tracing to compare Confirmed by Drema Pry [id number]) on 09/30/2020 12:34:11 AM      Radiology DG Chest 2 View  Result Date: 09/30/2020 CLINICAL DATA:  Left-sided chest pain EXAM: CHEST - 2 VIEW COMPARISON:  05/22/2018 FINDINGS: The heart size and mediastinal contours are within normal limits. Both lungs are clear. The visualized skeletal structures are unremarkable. IMPRESSION: No active cardiopulmonary disease. Electronically Signed   By: Jasmine Pang M.D.   On: 09/30/2020 01:50    Pertinent labs & imaging results that were available during my care of the patient were reviewed by me and considered in my medical decision making (see chart for details).  Medications Ordered in ED Medications  albuterol (VENTOLIN HFA) 108 (90 Base) MCG/ACT inhaler 6 puff (has no administration in time range)  ipratropium (ATROVENT HFA) inhaler 2 puff (has no administration in time range)  ketorolac (TORADOL) 15 MG/ML injection 15 mg (has no administration in time range)                                                                                                                                    Procedures Procedures  (including critical care time)  Medical Decision Making / ED Course I have reviewed the nursing  notes for this encounter and the patient's prior records (if available in EHR or on provided paperwork).   Gavin Riley was evaluated in Emergency Department on 09/30/2020 for the symptoms described in the history of present illness. He was evaluated in the context of the global COVID-19 pandemic, which necessitated consideration that the patient might be at risk for infection with the SARS-CoV-2 virus that causes COVID-19. Institutional protocols and algorithms that pertain to the evaluation of patients at risk for COVID-19 are in a state of rapid change based on information released by regulatory bodies including the CDC and federal and state organizations. These policies and algorithms were followed during the patient's care in the ED.  Pleuritic chest pain. Lungs with right-sided wheezing. EKG without acute ischemic changes or evidence of pericarditis. Low suspicion for ACS, troponin negative. No need for repeat trops. Low risk for PE.  Dimer negative. Chest x-ray without evidence suggestive of pneumonia, pneumothorax, pneumomediastinum.  No abnormal contour of the mediastinum to suggest dissection. No evidence of acute injuries. Not classic for aortic dissection or esophageal perforation.      Final Clinical Impression(s) / ED Diagnoses Final diagnoses:  Chest pain  Pleurisy   The patient appears reasonably screened and/or stabilized for discharge and I doubt any other medical condition or other [facility] requiring further screening, evaluation, or treatment in the ED at this time prior to discharge. Safe for discharge with strict return precautions.  Disposition: Discharge  Condition: Good  I have discussed the results, Dx and Tx plan with the patient/family who expressed understanding and agree(s) with the plan. Discharge instructions discussed at length. The patient/family was given strict return precautions who verbalized understanding of the instructions. No further questions at  time of discharge.    ED Discharge Orders    None        Follow Up: Primary care provider   if you do not have a primary care physician, contact [facility] at [phone] for referral      This chart was dictated using voice recognition software.  Despite best efforts to proofread,  errors can occur which can change the documentation meaning.   Nira Conn, MD 09/30/20 [phone]

## 2020-09-30 NOTE — ED Notes (Signed)
Patient transported to X-ray 

## 2020-09-30 NOTE — ED Triage Notes (Signed)
C/o left sided cp x 5 hrs while at work, denies SOB n/v . Increased pain with movt

## 2021-01-16 ENCOUNTER — Emergency Department (HOSPITAL_BASED_OUTPATIENT_CLINIC_OR_DEPARTMENT_OTHER)
Admission: EM | Admit: 2021-01-16 | Discharge: 2021-01-16 | Disposition: A | Discharge status: Home / Self Care | Payer: Medicaid Other | Attending: Emergency Medicine | Admitting: Emergency Medicine

## 2021-01-16 ENCOUNTER — Encounter (HOSPITAL_BASED_OUTPATIENT_CLINIC_OR_DEPARTMENT_OTHER): Payer: Self-pay

## 2021-01-16 ENCOUNTER — Other Ambulatory Visit: Payer: Self-pay

## 2021-01-16 DIAGNOSIS — F17210 Nicotine dependence, cigarettes, uncomplicated: Secondary | ICD-10-CM | POA: Insufficient documentation

## 2021-01-16 DIAGNOSIS — J4541 Moderate persistent asthma with (acute) exacerbation: Secondary | ICD-10-CM | POA: Diagnosis not present

## 2021-01-16 DIAGNOSIS — Z7951 Long term (current) use of inhaled steroids: Secondary | ICD-10-CM | POA: Diagnosis not present

## 2021-01-16 DIAGNOSIS — R0602 Shortness of breath: Secondary | ICD-10-CM | POA: Diagnosis present

## 2021-01-16 MED ORDER — IPRATROPIUM-ALBUTEROL 0.5-2.5 (3) MG/3ML IN SOLN
3.0000 mL | RESPIRATORY_TRACT | Status: AC
  Administered 2021-01-16 (×3): 3 mL via RESPIRATORY_TRACT
  Filled 2021-01-16: qty 9

## 2021-01-16 MED ORDER — PREDNISONE 20 MG PO TABS
40.0000 mg | ORAL_TABLET | Freq: Once | ORAL | Status: AC
  Administered 2021-01-16: 40 mg via ORAL
  Filled 2021-01-16: qty 2

## 2021-01-16 MED ORDER — ALBUTEROL SULFATE HFA 108 (90 BASE) MCG/ACT IN AERS
6.0000 | INHALATION_SPRAY | Freq: Once | RESPIRATORY_TRACT | Status: AC
  Administered 2021-01-16: 6 via RESPIRATORY_TRACT
  Filled 2021-01-16: qty 6.7

## 2021-01-16 MED ORDER — PREDNISONE 10 MG PO TABS: 40.0000 mg | ORAL_TABLET | Freq: Every day | ORAL | 0 refills | Status: AC

## 2021-01-16 NOTE — ED Provider Notes (Signed)
[facility] EMERGENCY DEPARTMENT Provider Note  CSN: [id number] Arrival date & time: 01/16/21 0327  Chief Complaint(s) Shortness of Breath  HPI Gavin Riley is a 25 y.o. male    Shortness of Breath Severity:  Moderate Onset quality:  Gradual Duration:  2 days Timing:  Constant Progression:  Worsening Chronicity:  Recurrent Context: URI   Relieved by:  Nothing Worsened by:  Activity and coughing Associated symptoms: cough and wheezing   Associated symptoms: no chest pain and no fever    Past Medical History Past Medical History:  Diagnosis Date   Asthma    Eczema    There are no problems to display for this patient.  Home Medication(s) Prior to Admission medications   Medication Sig Start Date End Date Taking? Authorizing Provider  predniSONE (DELTASONE) 10 MG tablet Take 4 tablets (40 mg total) by mouth daily for 4 days. 01/16/21 01/20/21 Yes Victoriah Wilds, Amadeo Garnet, MD  albuterol (VENTOLIN HFA) 108 (90 Base) MCG/ACT inhaler Inhale 2 puffs into the lungs every 4 (four) hours as needed for wheezing or shortness of breath. 03/21/20   Bing Neighbors, FNP  budesonide-formoterol (SYMBICORT) 80-4.5 MCG/ACT inhaler Inhale 2 puffs into the lungs 2 (two) times daily. 03/21/20   Bing Neighbors, FNP  loratadine (CLARITIN) 10 MG tablet Take 1 tablet (10 mg total) by mouth daily. 07/02/18   Hedges, Tinnie Gens, PA-C  triamcinolone cream (KENALOG) 0.1 % Apply 1 application topically 2 (two) times daily. 01/14/18   Cristina Gong, PA-C                                                                                                                                    Past Surgical History History reviewed. No pertinent surgical history. Family History Family History  Family history unknown: Yes    Social History Social History   Tobacco Use   Smoking status: Every Day    Packs/day: 0.50    Types: Cigarettes   Smokeless tobacco: Never  Vaping Use   Vaping  Use: Never used  Substance Use Topics   Alcohol use: No   Drug use: No   Allergies Shellfish allergy  Review of Systems Review of Systems  Constitutional:  Negative for fever.  Respiratory:  Positive for cough, shortness of breath and wheezing.   Cardiovascular:  Negative for chest pain.  All other systems are reviewed and are negative for acute change except as noted in the HPI  Physical Exam Vital Signs  I have reviewed the triage vital signs BP 138/89   Pulse 85   Temp 98.2 F (36.8 C)   Resp 20   Ht 5\' 11"  (1.803 m)   Wt 90.7 kg   SpO2 96%   BMI 27.89 kg/m   Physical Exam Vitals reviewed.  Constitutional:      General: He is not in acute distress.    Appearance: He is well-developed. He is  not diaphoretic.  HENT:     Head: Normocephalic and atraumatic.     Nose: Nose normal.  Eyes:     General: No scleral icterus.       Right eye: No discharge.        Left eye: No discharge.     Conjunctiva/sclera: Conjunctivae normal.     Pupils: Pupils are equal, round, and reactive to light.  Cardiovascular:     Rate and Rhythm: Normal rate and regular rhythm.     Heart sounds: No murmur heard.   No friction rub. No gallop.  Pulmonary:     Effort: Pulmonary effort is normal. Tachypnea present. No respiratory distress.     Breath sounds: No stridor. Examination of the right-upper field reveals wheezing. Examination of the left-upper field reveals wheezing. Examination of the right-middle field reveals wheezing. Examination of the left-middle field reveals wheezing. Examination of the right-lower field reveals wheezing. Examination of the left-lower field reveals wheezing. Wheezing present. No rales.  Abdominal:     General: There is no distension.     Palpations: Abdomen is soft.     Tenderness: There is no abdominal tenderness.  Musculoskeletal:        General: No tenderness.     Cervical back: Normal range of motion and neck supple.  Skin:    General: Skin is warm and  dry.     Findings: No erythema or rash.  Neurological:     Mental Status: He is alert and oriented to person, place, and time.    ED Results and Treatments Labs (all labs ordered are listed, but only abnormal results are displayed) Labs Reviewed - No data to display                                                                                                                       EKG  EKG Interpretation  Date/Time:    Ventricular Rate:    PR Interval:    QRS Duration:   QT Interval:    QTC Calculation:   R Axis:     Text Interpretation:         Radiology No results found.  Pertinent labs & imaging results that were available during my care of the patient were reviewed by me and considered in my medical decision making (see chart for details).  Medications Ordered in ED Medications  ipratropium-albuterol (DUONEB) 0.5-2.5 (3) MG/3ML nebulizer solution 3 mL (3 mLs Nebulization Given 01/16/21 0503)  albuterol (VENTOLIN HFA) 108 (90 Base) MCG/ACT inhaler 6 puff (6 puffs Inhalation Given 01/16/21 0427)  predniSONE (DELTASONE) tablet 40 mg (40 mg Oral Given 01/16/21 0454)  Procedures Procedures  (including critical care time)  Medical Decision Making / ED Course I have reviewed the nursing notes for this encounter and the patient's prior records (if available in EHR or on provided paperwork).   Gavin Riley was evaluated in Emergency Department on 01/16/2021 for the symptoms described in the history of present illness. He was evaluated in the context of the global COVID-19 pandemic, which necessitated consideration that the patient might be at risk for infection with the SARS-CoV-2 virus that causes COVID-19. Institutional protocols and algorithms that pertain to the evaluation of patients at risk for COVID-19 are in a state of rapid change based  on information released by regulatory bodies including the CDC and federal and state organizations. These policies and algorithms were followed during the patient's care in the ED.  Consistent with asthma exacerbation. Resolved with duoneb x 1, albuterol puffs x6, and prednisone. Lungs clear after treatment. No need for CXR. Doubt PNA.      Final Clinical Impression(s) / ED Diagnoses Final diagnoses:  Moderate persistent asthma with exacerbation    The patient appears reasonably screened and/or stabilized for discharge and I doubt any other medical condition or other [facility] requiring further screening, evaluation, or treatment in the ED at this time prior to discharge. Safe for discharge with strict return precautions.  Disposition: Discharge  Condition: Good  I have discussed the results, Dx and Tx plan with the patient/family who expressed understanding and agree(s) with the plan. Discharge instructions discussed at length. The patient/family was given strict return precautions who verbalized understanding of the instructions. No further questions at time of discharge.    ED Discharge Orders          Ordered    predniSONE (DELTASONE) 10 MG tablet  Daily        01/16/21 0544             Follow Up: Primary care provider  Call  if you do not have a primary care physician, contact [facility] at [phone] for referral     This chart was dictated using voice recognition software.  Despite best efforts to proofread,  errors can occur which can change the documentation meaning.    Nira Conn, MD 01/16/21 [phone]

## 2021-01-16 NOTE — ED Triage Notes (Signed)
Pt reports shortness of breath and increased work of breathing for a couple days. Suspects it is related to his asthma. Has been using a friends "Proair" inhaler since he does not have a PCP. Nonproductive cough.

## 2021-09-14 ENCOUNTER — Encounter (HOSPITAL_BASED_OUTPATIENT_CLINIC_OR_DEPARTMENT_OTHER): Payer: Self-pay | Admitting: Emergency Medicine

## 2021-09-14 ENCOUNTER — Emergency Department (HOSPITAL_BASED_OUTPATIENT_CLINIC_OR_DEPARTMENT_OTHER)
Admission: EM | Admit: 2021-09-14 | Discharge: 2021-09-14 | Disposition: A | Discharge status: Home / Self Care | Payer: Medicaid Other | Attending: Emergency Medicine | Admitting: Emergency Medicine

## 2021-09-14 ENCOUNTER — Other Ambulatory Visit: Payer: Self-pay

## 2021-09-14 DIAGNOSIS — S81011A Laceration without foreign body, right knee, initial encounter: Secondary | ICD-10-CM | POA: Diagnosis not present

## 2021-09-14 DIAGNOSIS — J45909 Unspecified asthma, uncomplicated: Secondary | ICD-10-CM | POA: Diagnosis not present

## 2021-09-14 DIAGNOSIS — S8991XA Unspecified injury of right lower leg, initial encounter: Secondary | ICD-10-CM | POA: Diagnosis present

## 2021-09-14 DIAGNOSIS — W208XXA Other cause of strike by thrown, projected or falling object, initial encounter: Secondary | ICD-10-CM | POA: Diagnosis not present

## 2021-09-14 DIAGNOSIS — Z23 Encounter for immunization: Secondary | ICD-10-CM | POA: Insufficient documentation

## 2021-09-14 MED ORDER — LIDOCAINE-EPINEPHRINE (PF) 1 %-1:200000 IJ SOLN
20.0000 mL | Freq: Once | INTRAMUSCULAR | Status: AC
  Administered 2021-09-14: 20 mL
  Filled 2021-09-14: qty 30

## 2021-09-14 MED ORDER — BACITRACIN ZINC 500 UNIT/GM EX OINT
TOPICAL_OINTMENT | Freq: Once | CUTANEOUS | Status: AC
  Administered 2021-09-14: 1 via TOPICAL

## 2021-09-14 MED ORDER — TETANUS-DIPHTH-ACELL PERTUSSIS 5-2.5-18.5 LF-MCG/0.5 IM SUSY
0.5000 mL | PREFILLED_SYRINGE | Freq: Once | INTRAMUSCULAR | Status: AC
  Administered 2021-09-14: 0.5 mL via INTRAMUSCULAR
  Filled 2021-09-14: qty 0.5

## 2021-09-14 NOTE — ED Triage Notes (Signed)
Pt states he cut his right knee on a curtain rod  Pt has a small laceration noted  Bleeding controlled  ?

## 2021-09-14 NOTE — ED Notes (Signed)
Patient discharged to home.  All discharge instructions reviewed.  Patient verbalized understanding via teachback method.  VS WDL.  Respirations even and unlabored.  Ambulatory out of ED.   °

## 2021-09-14 NOTE — ED Provider Notes (Signed)
?[facility] EMERGENCY DEPARTMENT ?Provider Note ? ? ?CSN: [id number] ?Arrival date & time: 09/14/21  0148 ? ?  ? ?History ? ?Chief Complaint  ?Patient presents with  ? Extremity Laceration  ? ? ?Gavin Riley is a 26 y.o. male. ? ?The history is provided by the patient.  ?He is a history of asthma and comes in after suffering a laceration to his right knee.  He states that a curtain rod dropped and caused a laceration.  He is not sure when his last tetanus immunization was.  He denies other injury. ?  ?Home Medications ?Prior to Admission medications   ?Medication Sig Start Date End Date Taking? Authorizing Provider  ?albuterol (VENTOLIN HFA) 108 (90 Base) MCG/ACT inhaler Inhale 2 puffs into the lungs every 4 (four) hours as needed for wheezing or shortness of breath. 03/21/20   Bing Neighbors, FNP  ?budesonide-formoterol (SYMBICORT) 80-4.5 MCG/ACT inhaler Inhale 2 puffs into the lungs 2 (two) times daily. 03/21/20   Bing Neighbors, FNP  ?loratadine (CLARITIN) 10 MG tablet Take 1 tablet (10 mg total) by mouth daily. 07/02/18   Hedges, Tinnie Gens, PA-C  ?triamcinolone cream (KENALOG) 0.1 % Apply 1 application topically 2 (two) times daily. 01/14/18   Cristina Gong, PA-C  ?   ? ?Allergies    ?Shellfish allergy   ? ?Review of Systems   ?Review of Systems  ?All other systems reviewed and are negative. ? ?Physical Exam ?Updated Vital Signs ?BP 134/83 (BP Location: Right Arm)   Pulse 81   Temp 98.4 ?F (36.9 ?C) (Oral)   Resp 14   Ht 5\' 11"  (1.803 m)   Wt 102.1 kg   SpO2 97%   BMI 31.38 kg/m?  ?Physical Exam ?Vitals and nursing note reviewed.  ?26 year old male, resting comfortably and in no acute distress. Vital signs are normal. Oxygen saturation is 97%, which is normal. ?Head is normocephalic and atraumatic. PERRLA, EOMI.  ?Neck is nontender and supple. ?Lungs are clear without rales, wheezes, or rhonchi. ?Chest is nontender. ?Heart has regular rate and rhythm without murmur. ?Abdomen is  soft, flat, nontender . ?Extremities: Laceration present on the anterior aspect of the right knee distal to the patella. ?Skin is warm and dry without rash. ?Neurologic: Mental status is normal, cranial nerves are intact, moves all extremities equally. ? ?ED Results / Procedures / Treatments   ?Labs ?(all labs ordered are listed, but only abnormal results are displayed) ?Labs Reviewed - No data to display ? ?EKG ?None ? ?Radiology ?No results found. ? ?Procedures ?25.Laceration Repair ? ?Date/Time: 09/14/2021 2:35 AM ?Performed by: 09/16/2021, MD ?Authorized by: Dione Booze, MD  ? ?Consent:  ?  Consent obtained:  Verbal ?  Consent given by:  Patient ?  Risks, benefits, and alternatives were discussed: yes   ?  Risks discussed:  Infection, pain and poor wound healing ?  Alternatives discussed:  No treatment ?Universal protocol:  ?  Procedure explained and questions answered to patient or proxy's satisfaction: yes   ?  Relevant documents present and verified: yes   ?  Required blood products, implants, devices, and special equipment available: yes   ?  Site/side marked: yes   ?  Immediately prior to procedure, a time out was called: yes   ?  Patient identity confirmed:  Verbally with patient and arm band ?Anesthesia:  ?  Anesthesia method:  Local infiltration ?  Local anesthetic:  Lidocaine 1% WITH epi ?Laceration details:  ?  Location:  Leg ?  Leg location:  R knee ?  Length (cm):  2 ?  Depth (mm):  2 ?Pre-procedure details:  ?  Preparation:  Patient was prepped and draped in usual sterile fashion ?Exploration:  ?  Limited defect created (wound extended): no   ?  Hemostasis achieved with:  Direct pressure ?  Wound exploration: entire depth of wound visualized   ?  Wound extent: no foreign bodies/material noted and no tendon damage noted   ?  Contaminated: no   ?Treatment:  ?  Area cleansed with:  Saline ?  Amount of cleaning:  Standard ?  Debridement:  None ?  Undermining:  None ?  Scar revision: no   ?Skin repair:   ?  Repair method:  Sutures ?  Suture size:  4-0 ?  Suture material:  Prolene ?  Suture technique:  Simple interrupted ?  Number of sutures:  3 ?Approximation:  ?  Approximation:  Close ?Repair type:  ?  Repair type:  Simple ?Post-procedure details:  ?  Dressing:  Antibiotic ointment and adhesive bandage ?  Procedure completion:  Tolerated well, no immediate complications  ? ? ?Medications Ordered in ED ?Medications  ?bacitracin ointment (has no administration in time range)  ?Tdap (BOOSTRIX) injection 0.5 mL (0.5 mLs Intramuscular Given 09/14/21 0217)  ?lidocaine-EPINEPHrine (PF) (XYLOCAINE-EPINEPHrine) 1 %-1:200000 (PF) injection 20 mL (20 mLs Infiltration Given 09/14/21 0217)  ? ? ?ED Course/ Medical Decision Making/ A&P ?  ?                        ?Medical Decision Making ?Risk ?Prescription drug management. ? ? ?Laceration of the right knee with significant gaping of skin edges.  Laceration is closed with sutures.  Tdap booster is given. ? ?Clinical Impression(s) / ED Diagnoses ?Final diagnoses:  ?Laceration of right knee, initial encounter  ? ? ?Rx / DC Orders ?ED Discharge Orders   ? ? None  ? ?  ? ? ?  ?Dione Booze, MD ?09/14/21 [phone] ? ?

## 2021-09-22 ENCOUNTER — Other Ambulatory Visit: Payer: Self-pay

## 2021-09-22 ENCOUNTER — Encounter (HOSPITAL_BASED_OUTPATIENT_CLINIC_OR_DEPARTMENT_OTHER): Payer: Self-pay | Admitting: Emergency Medicine

## 2021-09-22 ENCOUNTER — Emergency Department (HOSPITAL_BASED_OUTPATIENT_CLINIC_OR_DEPARTMENT_OTHER)
Admission: EM | Admit: 2021-09-22 | Discharge: 2021-09-22 | Disposition: A | Discharge status: Home / Self Care | Payer: Medicaid Other | Attending: Emergency Medicine | Admitting: Emergency Medicine

## 2021-09-22 DIAGNOSIS — S8991XD Unspecified injury of right lower leg, subsequent encounter: Secondary | ICD-10-CM | POA: Diagnosis present

## 2021-09-22 DIAGNOSIS — X58XXXD Exposure to other specified factors, subsequent encounter: Secondary | ICD-10-CM | POA: Diagnosis not present

## 2021-09-22 DIAGNOSIS — S81011D Laceration without foreign body, right knee, subsequent encounter: Secondary | ICD-10-CM | POA: Insufficient documentation

## 2021-09-22 DIAGNOSIS — Z4802 Encounter for removal of sutures: Secondary | ICD-10-CM | POA: Insufficient documentation

## 2021-09-22 NOTE — ED Triage Notes (Signed)
Pt is here to have sutures removed from his knee  ?

## 2021-09-22 NOTE — ED Notes (Signed)
Patient given discharge instructions. Questions were answered. Patient verbalized understanding of discharge instructions and care at home.  

## 2021-09-22 NOTE — ED Provider Notes (Signed)
?[facility] EMERGENCY DEPARTMENT ?Provider Note ? ? ?CSN: [id number] ?Arrival date & time: 09/22/21  1902 ? ?  ? ?History ? ?Chief Complaint  ?Patient presents with  ? Suture / Staple Removal  ? ? ?[name] is a 26 y.o. male who presents to the ED today for suture removal.  Patient was seen in the ED on 03/28 after sustaining a laceration to his right knee.  He had 3 4-0 sutures placed.  He states that the wound has been healing well since that time.  He denies any erythema, edema, drainage of pus, fevers, chills.  He has no other complaints at this time. ? ?The history is provided by the patient and medical records.  ? ?  ? ?Home Medications ?Prior to Admission medications   ?Medication Sig Start Date End Date Taking? Authorizing Provider  ?albuterol (VENTOLIN HFA) 108 (90 Base) MCG/ACT inhaler Inhale 2 puffs into the lungs every 4 (four) hours as needed for wheezing or shortness of breath. 03/21/20   Bing Neighbors, FNP  ?budesonide-formoterol (SYMBICORT) 80-4.5 MCG/ACT inhaler Inhale 2 puffs into the lungs 2 (two) times daily. 03/21/20   Bing Neighbors, FNP  ?loratadine (CLARITIN) 10 MG tablet Take 1 tablet (10 mg total) by mouth daily. 07/02/18   Hedges, Tinnie Gens, PA-C  ?triamcinolone cream (KENALOG) 0.1 % Apply 1 application topically 2 (two) times daily. 01/14/18   Cristina Gong, PA-C  ?   ? ?Allergies    ?Shellfish allergy   ? ?Review of Systems   ?Review of Systems  ?Constitutional:  Negative for chills and fever.  ?Skin:  Positive for wound. Negative for color change.  ?All other systems reviewed and are negative. ? ?Physical Exam ?Updated Vital Signs ?BP 134/90 (BP Location: Right Arm)   Pulse 81   Temp 98.7 ?F (37.1 ?C) (Oral)   Resp 16   Ht 6' (1.829 m)   Wt 90.7 kg   SpO2 100%   BMI 27.12 kg/m?  ?Physical Exam ?Vitals and nursing note reviewed.  ?Constitutional:   ?   Appearance: He is not ill-appearing.  ?HENT:  ?   Head: Normocephalic and atraumatic.  ?Eyes:  ?    Conjunctiva/sclera: Conjunctivae normal.  ?Cardiovascular:  ?   Rate and Rhythm: Normal rate and regular rhythm.  ?Pulmonary:  ?   Effort: Pulmonary effort is normal.  ?   Breath sounds: Normal breath sounds.  ?Musculoskeletal:  ?   Comments: Well-healing laceration to right knee with sutures in place.  No erythema, edema, drainage of pus.  ?Skin: ?   General: Skin is warm and dry.  ?   Coloration: Skin is not jaundiced.  ?Neurological:  ?   Mental Status: He is alert.  ? ? ?ED Results / Procedures / Treatments   ?Labs ?(all labs ordered are listed, but only abnormal results are displayed) ?Labs Reviewed - No data to display ? ?EKG ?None ? ?Radiology ?No results found. ? ?Procedures ?Marland KitchenSuture Removal ? ?Date/Time: 09/22/2021 7:59 PM ?Performed by: Tanda Rockers, PA-C ?Authorized by: Tanda Rockers, PA-C  ? ?Consent:  ?  Consent obtained:  Verbal ?  Consent given by:  Patient ?Universal protocol:  ?  Patient identity confirmed:  Verbally with patient ?Location:  ?  Location:  Lower extremity ?  Lower extremity location:  Knee ?  Knee location:  R knee ?Procedure details:  ?  Wound appearance:  No signs of infection ?  Number of sutures removed:  3 ?Post-procedure details:  ?  Post-removal:  No dressing applied ?  Procedure completion:  Tolerated well, no immediate complications  ? ? ?Medications Ordered in ED ?Medications - No data to display ? ?ED Course/ Medical Decision Making/ A&P ?  ?                        ?Medical Decision Making ?26 year old male who presents to the ED for suture removal.  Seen in the ED on 03/28 and had sutures placed.  Wound has been healing well since that time.  On arrival to the ED vitals are stable and patient appears to be no acute distress.  He has well-healing laceration without signs of infection.  Sutures removed without complication.  If patient stable for discharge home at this time.  Wound care discussed. ? ? ? ? ? ? ? ? ? ?Final Clinical Impression(s) / ED Diagnoses ?Final  diagnoses:  ?Visit for suture removal  ? ? ?Rx / DC Orders ?ED Discharge Orders   ? ? None  ? ?  ? ?Discharge Instructions   ?None ?  ? ? ?  ?Tanda Rockers, PA-C ?09/22/21 2000 ? ?  ?Terrilee Files, MD ?09/23/21 [phone] ? ?

## 2021-12-16 IMAGING — DX DG CHEST 2V
2 series · 2 of 2 positions shown · non-contrast
Comparison: 05/22/2018

CLINICAL DATA: Left-sided chest pain

EXAM:
CHEST - 2 VIEW

[chest pa]
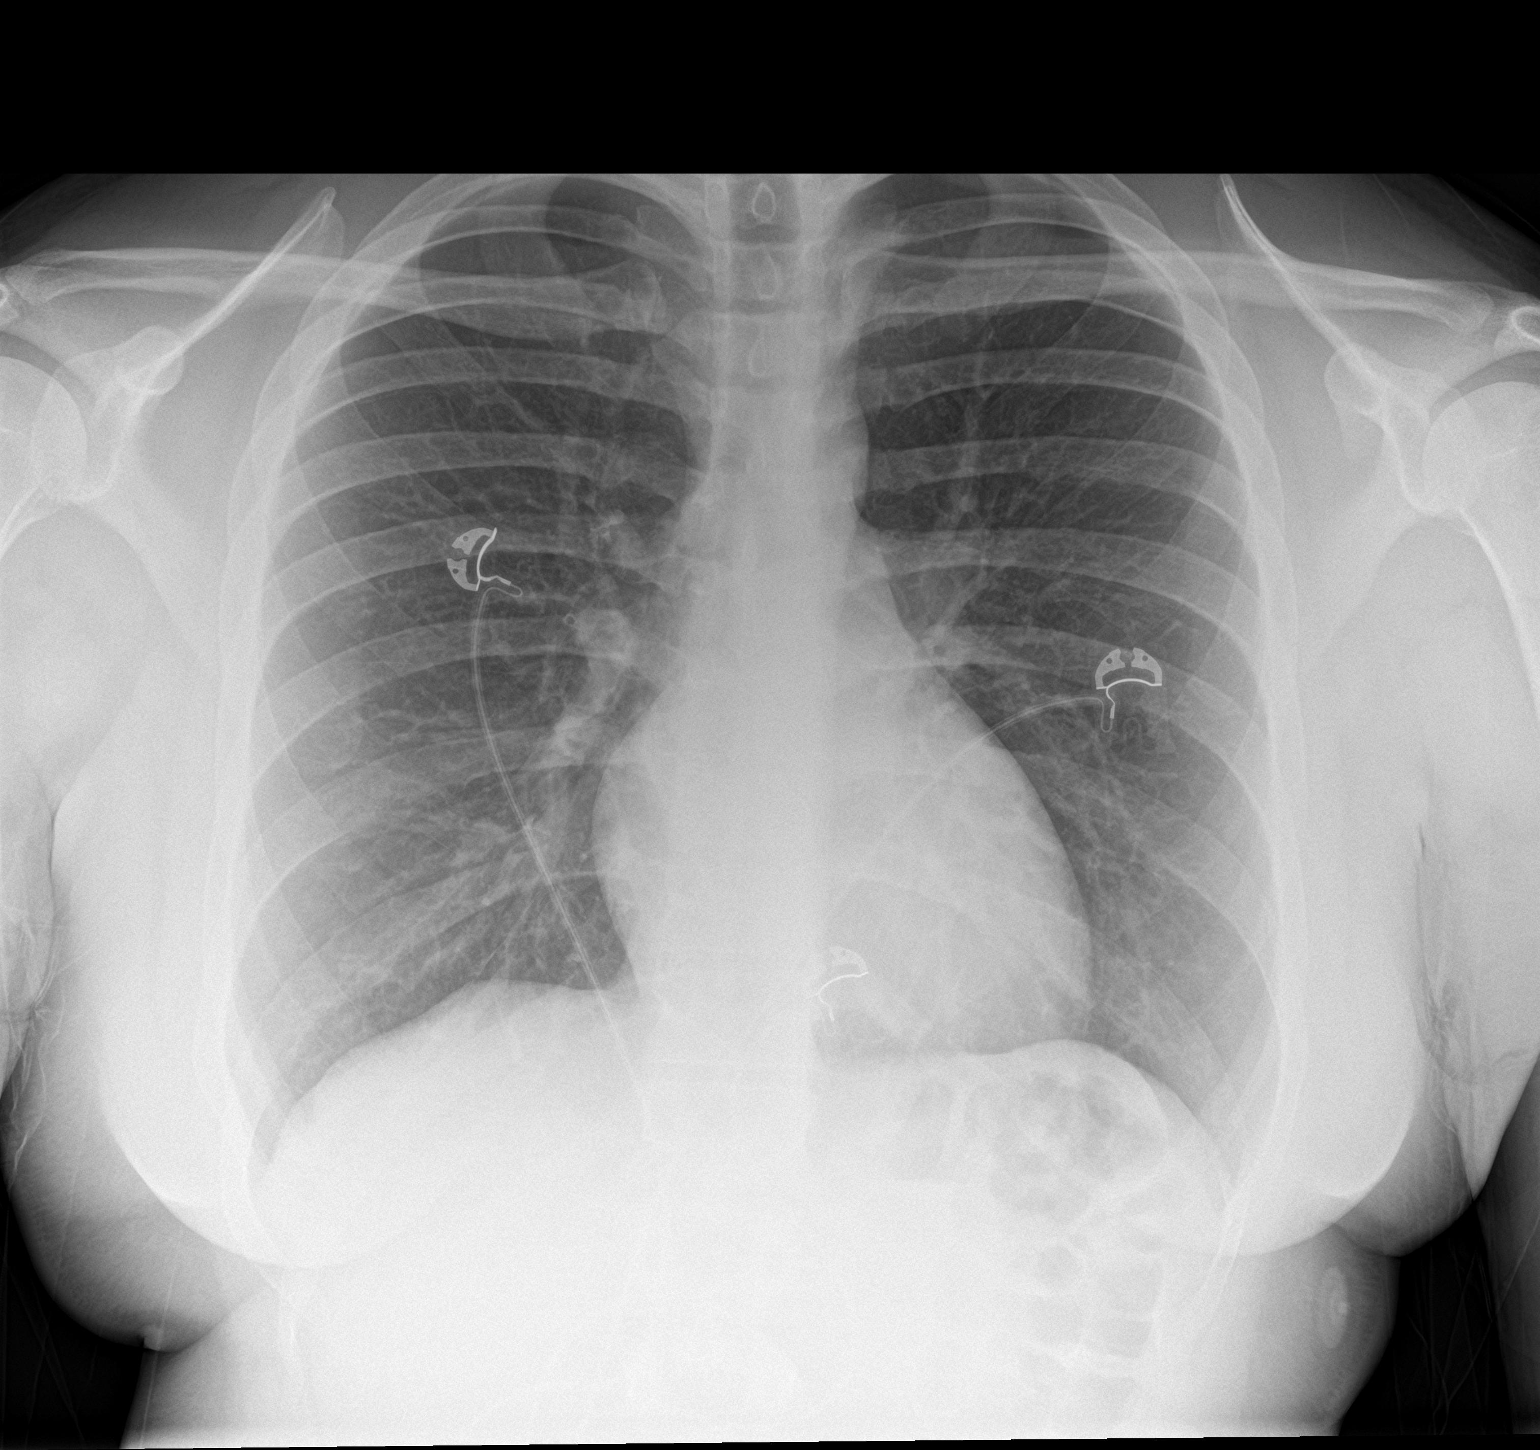

[chest lat]
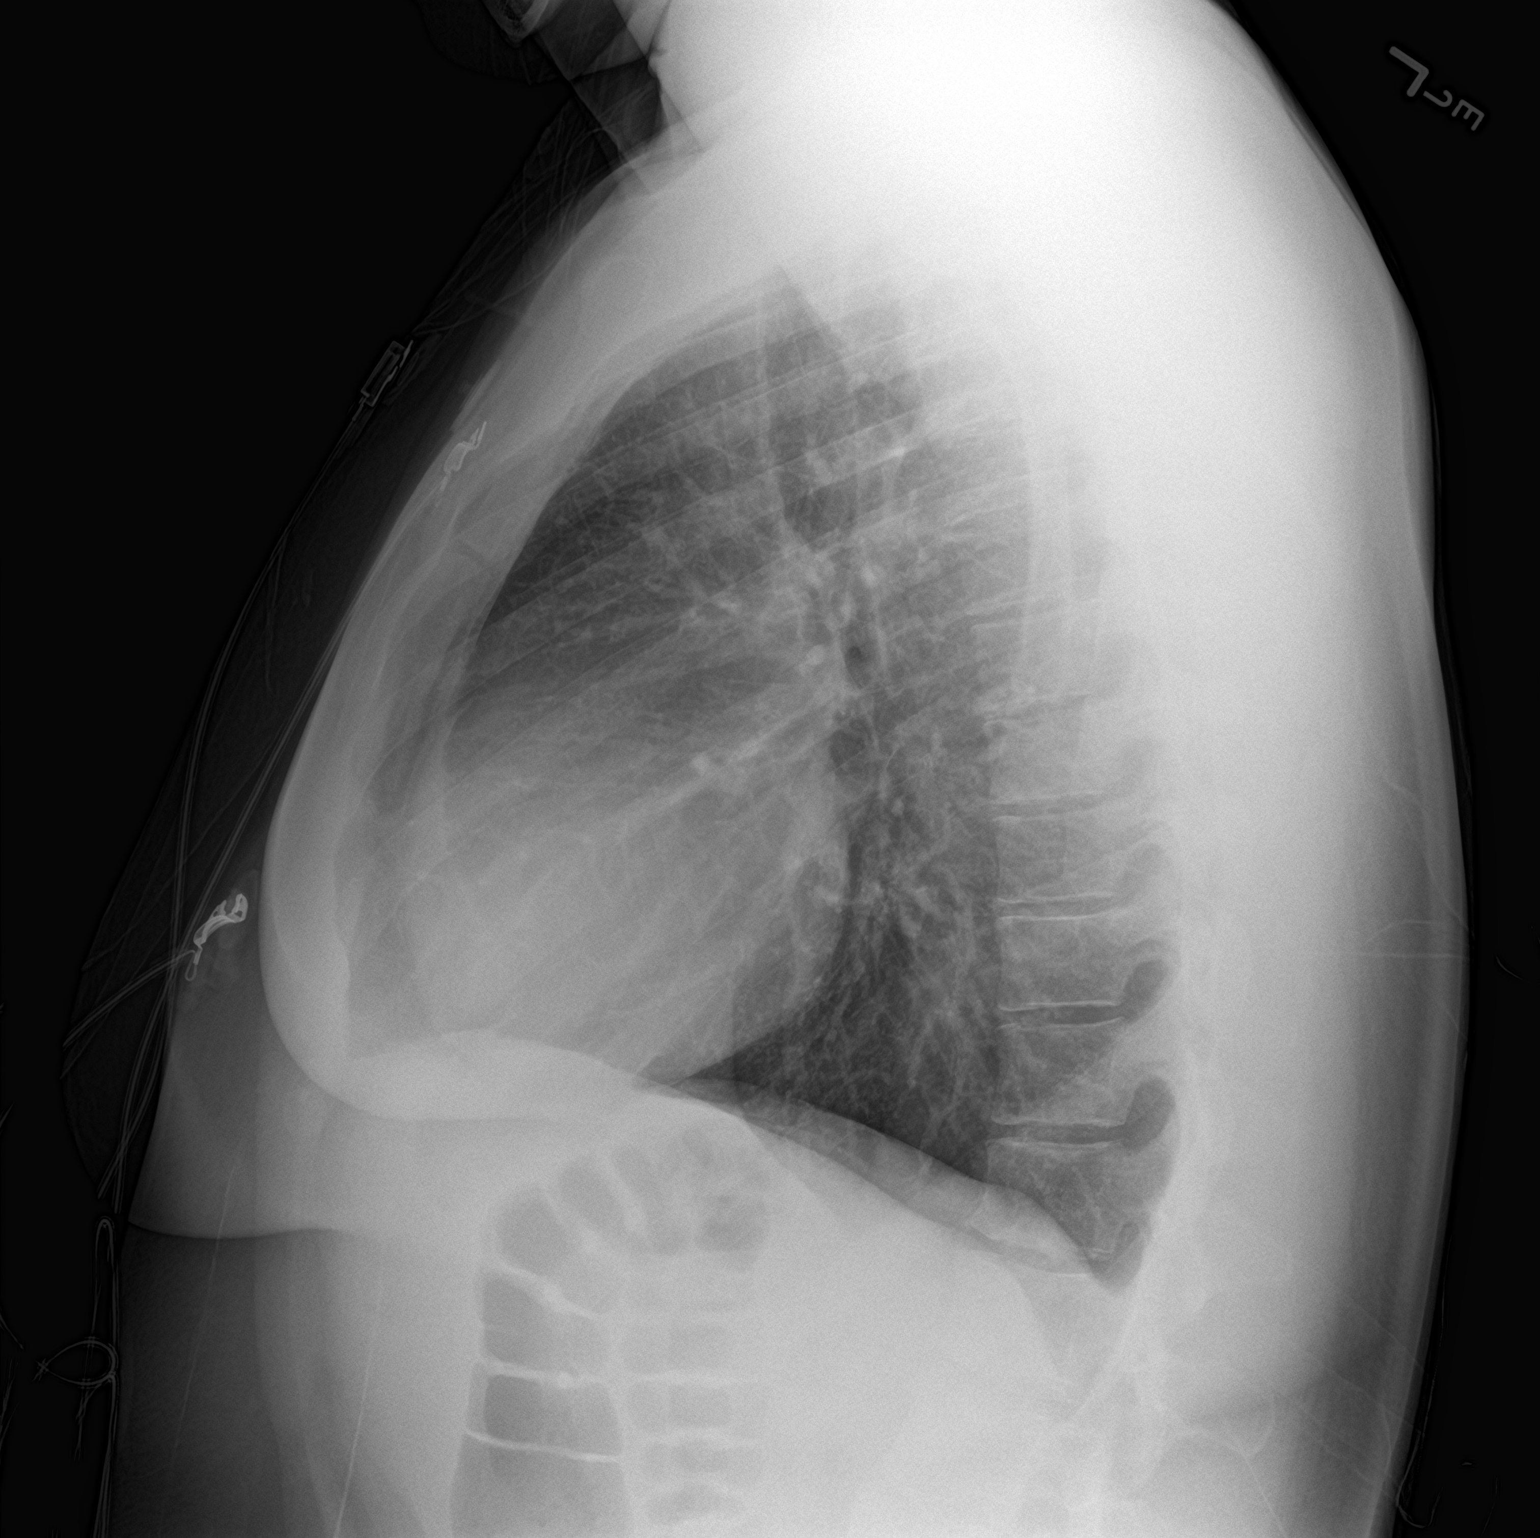

[2 of 2 positions shown; findings below may reference images not displayed]

FINDINGS: The heart size and mediastinal contours are within normal limits.
Both lungs are clear. The visualized skeletal structures are
unremarkable.
IMPRESSION: No active cardiopulmonary disease.

## 2023-02-26 ENCOUNTER — Encounter (HOSPITAL_BASED_OUTPATIENT_CLINIC_OR_DEPARTMENT_OTHER): Payer: Self-pay | Admitting: *Deleted

## 2023-02-26 ENCOUNTER — Emergency Department (HOSPITAL_BASED_OUTPATIENT_CLINIC_OR_DEPARTMENT_OTHER)
Admission: EM | Admit: 2023-02-26 | Discharge: 2023-02-26 | Disposition: A | Discharge status: Home / Self Care | Payer: Medicaid Other | Attending: Emergency Medicine | Admitting: Emergency Medicine

## 2023-02-26 ENCOUNTER — Other Ambulatory Visit: Payer: Self-pay

## 2023-02-26 DIAGNOSIS — J302 Other seasonal allergic rhinitis: Secondary | ICD-10-CM | POA: Diagnosis not present

## 2023-02-26 DIAGNOSIS — R040 Epistaxis: Secondary | ICD-10-CM | POA: Diagnosis present

## 2023-02-26 MED ORDER — OXYMETAZOLINE HCL 0.05 % NA SOLN: 1.0000 | Freq: Two times a day (BID) | NASAL | 0 refills | Status: DC

## 2023-02-26 MED ORDER — AYR SALINE NASAL NA GEL
1.0000 | NASAL | 0 refills | Status: DC | PRN
Start: 2023-02-26 — End: 2023-03-30

## 2023-02-26 MED ORDER — CETIRIZINE HCL 10 MG PO TABS: 10.0000 mg | ORAL_TABLET | Freq: Every day | ORAL | 0 refills | Status: DC

## 2023-02-26 NOTE — ED Triage Notes (Signed)
Pt is here for nosebleed intermittent x1 week.  Pt reports that it is from right nare.  Pt has had URI.  Bleeding controlled at this moment

## 2023-02-26 NOTE — ED Provider Notes (Signed)
[place] EMERGENCY DEPARTMENT AT [facility] Provider Note   CSN: [id number] Arrival date & time: 02/26/23  1945     History  Chief Complaint  Patient presents with   Epistaxis    Rohnan CORON GRASER is a 27 y.o. male here for evaluation of epistaxis.  Patient states he has had recurrent nosebleed over the last week.  States he is doing a lot of congestion, rhinorrhea which is typical of him due to his seasonal allergies.  Subsequently ends up blowing his nose more frequently which is causing nosebleed to his right nares.  He denies any clots, blood going to the back of his throat.  No intranasal illicit substance use.  No blood thinners.  No recent injury or trauma.  No history of clotting disorders, no gingival bleeding, easy bruising, blood in urine or blood in stool.  No meds PTA.  HPI     Home Medications Prior to Admission medications   Medication Sig Start Date End Date Taking? Authorizing Provider  cetirizine (ZYRTEC ALLERGY) 10 MG tablet Take 1 tablet (10 mg total) by mouth daily. 02/26/23  Yes Eymi Lipuma A, PA-C  oxymetazoline (AFRIN NASAL SPRAY) 0.05 % nasal spray Place 1 spray into both nostrils 2 (two) times daily. 02/26/23  Yes Celestino Ackerman A, PA-C  saline (AYR) GEL Place 1 Application into both nostrils every 4 (four) hours as needed. 02/26/23  Yes Jamilia Jacques A, PA-C  albuterol (VENTOLIN HFA) 108 (90 Base) MCG/ACT inhaler Inhale 2 puffs into the lungs every 4 (four) hours as needed for wheezing or shortness of breath. 03/21/20   Bing Neighbors, NP  budesonide-formoterol (SYMBICORT) 80-4.5 MCG/ACT inhaler Inhale 2 puffs into the lungs 2 (two) times daily. 03/21/20   Bing Neighbors, NP  triamcinolone cream (KENALOG) 0.1 % Apply 1 application topically 2 (two) times daily. 01/14/18   Cristina Gong, PA-C      Allergies    Shellfish allergy    Review of Systems   Review of Systems  Constitutional: Negative.   HENT:  Positive for  nosebleeds, postnasal drip, rhinorrhea and sneezing.   Respiratory: Negative.    Cardiovascular: Negative.   Gastrointestinal: Negative.   Genitourinary: Negative.   Musculoskeletal: Negative.   Skin: Negative.   Neurological: Negative.   All other systems reviewed and are negative.   Physical Exam Updated Vital Signs BP 135/85 (BP Location: Left Arm)   Pulse 86   Temp 98.8 F (37.1 C) (Oral)   Resp 16   Wt 90.7 kg   SpO2 97%   BMI 27.12 kg/m  Physical Exam Vitals and nursing note reviewed.  Constitutional:      General: He is not in acute distress.    Appearance: He is well-developed. He is not ill-appearing, toxic-appearing or diaphoretic.  HENT:     Head: Normocephalic and atraumatic.     Jaw: There is normal jaw occlusion.     Nose: Mucosal edema, congestion and rhinorrhea present. No nasal deformity, septal deviation, signs of injury, laceration or nasal tenderness. Rhinorrhea is clear.     Right Nostril: Epistaxis present. No foreign body, septal hematoma or occlusion.     Left Nostril: No foreign body, epistaxis, septal hematoma or occlusion.     Right Turbinates: Enlarged and swollen.     Left Turbinates: Enlarged and swollen.     Right Sinus: No maxillary sinus tenderness or frontal sinus tenderness.     Left Sinus: No maxillary sinus tenderness or frontal sinus  tenderness.     Comments: Left nares clear Right nares with some erythema to right septum, boggy turbinates, no active epistaxis    Mouth/Throat:     Lips: Pink.     Mouth: Mucous membranes are moist.     Pharynx: Oropharynx is clear. Uvula midline.     Comments: Po clear Eyes:     Pupils: Pupils are equal, round, and reactive to light.  Cardiovascular:     Rate and Rhythm: Normal rate and regular rhythm.     Pulses: Normal pulses.     Heart sounds: Normal heart sounds.  Pulmonary:     Effort: Pulmonary effort is normal. No respiratory distress.     Breath sounds: Normal breath sounds and air  entry.  Abdominal:     General: There is no distension.     Palpations: Abdomen is soft.  Musculoskeletal:        General: Normal range of motion.     Cervical back: Full passive range of motion without pain, normal range of motion and neck supple.  Skin:    General: Skin is warm and dry.  Neurological:     General: No focal deficit present.     Mental Status: He is alert and oriented to person, place, and time.     ED Results / Procedures / Treatments   Labs (all labs ordered are listed, but only abnormal results are displayed) Labs Reviewed - No data to display  EKG None  Radiology No results found.  Procedures Procedures    Medications Ordered in ED Medications - No data to display  ED Course/ Medical Decision Making/ A&P   27 year old here for evaluation of intermittent epistaxis to right nares over the last week.  Deals with seasonal allergies has been having some congestion and rhinorrhea as well as some sneezing.  No clots.  He is not bleeding from any other areas.  He has no active epistaxis at this time during ED visit.  On exam he has active rhinorrhea, sneezing.  He is able to blow his nose without any epistaxis.  He does appear to have some erythema and some dilated blood vessels to the right septum.  I suspect his epistaxis is likely due to allergies.  Will start on antihistamine, Afrin as needed as well as AYR gel.  Will have him follow-up, return for new or worsening symptoms.  The patient has been appropriately medically screened and/or stabilized in the ED. I have low suspicion for any other emergent medical condition which would require further screening, evaluation or treatment in the ED or require inpatient management.  Patient is hemodynamically stable and in no acute distress.  Patient able to ambulate in department prior to ED.  Evaluation does not show acute pathology that would require ongoing or additional emergent interventions while in the  emergency department or further inpatient treatment.  I have discussed the diagnosis with the patient and answered all questions.  Pain is been managed while in the emergency department and patient has no further complaints prior to discharge.  Patient is comfortable with plan discussed in room and is stable for discharge at this time.  I have discussed strict return precautions for returning to the emergency department.  Patient was encouraged to follow-up with PCP/specialist refer to at discharge.                                  Medical  Decision Making Amount and/or Complexity of Data Reviewed External Data Reviewed: labs, radiology and notes.  Risk OTC drugs. Prescription drug management. Diagnosis or treatment significantly limited by social determinants of health.          Final Clinical Impression(s) / ED Diagnoses Final diagnoses:  Epistaxis  Seasonal allergies    Rx / DC Orders ED Discharge Orders          Ordered    oxymetazoline (AFRIN NASAL SPRAY) 0.05 % nasal spray  2 times daily        02/26/23 2059    saline (AYR) GEL  Every 4 hours PRN        02/26/23 2059    cetirizine (ZYRTEC ALLERGY) 10 MG tablet  Daily        02/26/23 2059              Bryn Saline A, PA-C 02/26/23 2233    Elayne Snare K, DO 02/26/23 2357

## 2023-02-26 NOTE — Discharge Instructions (Signed)
It was a pleasure taking care of you here in the emergency department today  Your nosebleed is likely being caused by allergies.  We discharged a few medications  Zyrtec-this is an antihistamine that will help to dry up your nasal passages Afrin-this medication will shrink the vessels in your nose to help to stop the nosebleed AYR gel-this medication will help to moisten the nose  If you develop an additional nosebleed make sure to hold pressure.  If does not stop after 1 hour please seek reevaluation emergency department

## 2023-03-02 ENCOUNTER — Emergency Department (HOSPITAL_BASED_OUTPATIENT_CLINIC_OR_DEPARTMENT_OTHER)
Admission: EM | Admit: 2023-03-02 | Discharge: 2023-03-02 | Discharge status: Against Medical Advice | Payer: Medicaid Other | Attending: Emergency Medicine | Admitting: Emergency Medicine

## 2023-03-02 ENCOUNTER — Other Ambulatory Visit: Payer: Self-pay

## 2023-03-02 ENCOUNTER — Encounter (HOSPITAL_BASED_OUTPATIENT_CLINIC_OR_DEPARTMENT_OTHER): Payer: Self-pay | Admitting: Emergency Medicine

## 2023-03-02 DIAGNOSIS — Z5321 Procedure and treatment not carried out due to patient leaving prior to being seen by health care provider: Secondary | ICD-10-CM | POA: Insufficient documentation

## 2023-03-02 DIAGNOSIS — R040 Epistaxis: Secondary | ICD-10-CM | POA: Insufficient documentation

## 2023-03-02 NOTE — ED Triage Notes (Signed)
Pt with nosebleed x 30 mins; was seen recently for same

## 2023-03-30 ENCOUNTER — Other Ambulatory Visit: Payer: Self-pay

## 2023-03-30 ENCOUNTER — Emergency Department (HOSPITAL_BASED_OUTPATIENT_CLINIC_OR_DEPARTMENT_OTHER)
Admission: EM | Admit: 2023-03-30 | Discharge: 2023-03-30 | Disposition: A | Discharge status: Home / Self Care | Payer: Medicaid Other | Attending: Emergency Medicine | Admitting: Emergency Medicine

## 2023-03-30 ENCOUNTER — Encounter (HOSPITAL_BASED_OUTPATIENT_CLINIC_OR_DEPARTMENT_OTHER): Payer: Self-pay

## 2023-03-30 DIAGNOSIS — R040 Epistaxis: Secondary | ICD-10-CM | POA: Insufficient documentation

## 2023-03-30 LAB — CBC WITH DIFFERENTIAL/PLATELET
Abs Immature Granulocytes: 0.02 10*3/uL (ref 0.00–0.07)
Basophils Absolute: 0.1 10*3/uL (ref 0.0–0.1)
Basophils Relative: 1 %
Eosinophils Absolute: 0.2 10*3/uL (ref 0.0–0.5)
Eosinophils Relative: 2 %
HCT: 45.8 % (ref 39.0–52.0)
Hemoglobin: 15.4 g/dL (ref 13.0–17.0)
Immature Granulocytes: 0 %
Lymphocytes Relative: 39 %
Lymphs Abs: 2.8 10*3/uL (ref 0.7–4.0)
MCH: 30.5 pg (ref 26.0–34.0)
MCHC: 33.6 g/dL (ref 30.0–36.0)
MCV: 90.7 fL (ref 80.0–100.0)
Monocytes Absolute: 0.5 10*3/uL (ref 0.1–1.0)
Monocytes Relative: 6 %
Neutro Abs: 3.6 10*3/uL (ref 1.7–7.7)
Neutrophils Relative %: 52 %
Platelets: 277 10*3/uL (ref 150–400)
RBC: 5.05 MIL/uL (ref 4.22–5.81)
RDW: 14.4 % (ref 11.5–15.5)
WBC: 7.1 10*3/uL (ref 4.0–10.5)
nRBC: 0 % (ref 0.0–0.2)

## 2023-03-30 MED ORDER — OXYMETAZOLINE HCL 0.05 % NA SOLN
1.0000 | Freq: Two times a day (BID) | NASAL | 0 refills | Status: AC
Start: 2023-03-30 — End: ?

## 2023-03-30 MED ORDER — OXYMETAZOLINE HCL 0.05 % NA SOLN
1.0000 | Freq: Once | NASAL | Status: AC
  Administered 2023-03-30: 1 via NASAL
  Filled 2023-03-30: qty 30

## 2023-03-30 MED ORDER — AYR SALINE NASAL NA GEL: 1.0000 | NASAL | 0 refills | Status: AC | PRN

## 2023-03-30 MED ORDER — CETIRIZINE HCL 10 MG PO TABS: 10.0000 mg | ORAL_TABLET | Freq: Every day | ORAL | 0 refills | Status: AC

## 2023-03-30 NOTE — ED Triage Notes (Signed)
Pt c/o nosebleed x 30-45 mins that began while cleaning. Bleeding well controlled at this time, slight oozing of blood with pressure applied. Pt sts hx of same.

## 2023-03-30 NOTE — Discharge Instructions (Addendum)
Take the medications as prescribed.  Call number listed in your discharge paperwork to establish care with PCP.

## 2023-03-30 NOTE — ED Notes (Signed)
All hemorrhage controlled at this time with Afrin.

## 2023-03-30 NOTE — ED Provider Notes (Signed)
[place] EMERGENCY DEPARTMENT AT [facility] Provider Note   CSN: [id number] Arrival date & time: 03/30/23  2208    History  Chief Complaint  Patient presents with   Epistaxis    Gavin Riley is a 27 y.o. male for evaluation of nosebleed.  She had approximately 30 minutes ago while cleaning.  Has been having intermittent nosebleeds over the last month.  He was using antihistamine prescription which he received a prior ED visit.  Actually saw him at that visit where he had his first nosebleed.  States the prescription ran out and he subsequently stopped taking the medication. No illicit substance use. No recent trauma. No hx of anemia, cytopenia. No weakness, night sweats, SOB. No anticoagulation use.  HPI     Home Medications Prior to Admission medications   Medication Sig Start Date End Date Taking? Authorizing Provider  albuterol (VENTOLIN HFA) 108 (90 Base) MCG/ACT inhaler Inhale 2 puffs into the lungs every 4 (four) hours as needed for wheezing or shortness of breath. 03/21/20   Bing Neighbors, NP  budesonide-formoterol (SYMBICORT) 80-4.5 MCG/ACT inhaler Inhale 2 puffs into the lungs 2 (two) times daily. 03/21/20   Bing Neighbors, NP  cetirizine (ZYRTEC ALLERGY) 10 MG tablet Take 1 tablet (10 mg total) by mouth daily. 03/30/23   Rettie Laird A, PA-C  oxymetazoline (AFRIN NASAL SPRAY) 0.05 % nasal spray Place 1 spray into both nostrils 2 (two) times daily. 03/30/23   Gaynell Eggleton A, PA-C  saline (AYR) GEL Place 1 Application into both nostrils every 4 (four) hours as needed. 03/30/23   Banessa Mao A, PA-C  triamcinolone cream (KENALOG) 0.1 % Apply 1 application topically 2 (two) times daily. 01/14/18   Cristina Gong, PA-C      Allergies    Shellfish allergy    Review of Systems   Review of Systems  Constitutional: Negative.   HENT:  Positive for nosebleeds.   Respiratory: Negative.    Cardiovascular: Negative.    Gastrointestinal: Negative.   Genitourinary: Negative.   Musculoskeletal: Negative.   Skin: Negative.   Neurological: Negative.   All other systems reviewed and are negative.   Physical Exam Updated Vital Signs BP 136/81   Pulse 88   Resp 18   Ht 5\' 11"  (1.803 m)   Wt 90.7 kg   SpO2 97%   BMI 27.89 kg/m  Physical Exam Vitals and nursing note reviewed.  Constitutional:      General: He is not in acute distress.    Appearance: He is well-developed. He is not ill-appearing or diaphoretic.  HENT:     Head: Normocephalic.     Nose:     Right Nostril: Epistaxis present.     Left Nostril: Epistaxis present.     Comments: Epistaxis. Difficult to tell which side is bleeding Eyes:     Pupils: Pupils are equal, round, and reactive to light.  Cardiovascular:     Rate and Rhythm: Normal rate and regular rhythm.  Pulmonary:     Effort: Pulmonary effort is normal. No respiratory distress.  Abdominal:     General: There is no distension.     Palpations: Abdomen is soft.  Musculoskeletal:        General: Normal range of motion.     Cervical back: Normal range of motion and neck supple.  Skin:    General: Skin is warm and dry.  Neurological:     General: No focal deficit present.  Mental Status: He is alert and oriented to person, place, and time.     ED Results / Procedures / Treatments   Labs (all labs ordered are listed, but only abnormal results are displayed) Labs Reviewed  CBC WITH DIFFERENTIAL/PLATELET    EKG None  Radiology No results found.  Procedures .Epistaxis Management  Date/Time: 03/30/2023 10:56 PM  Performed by: Linwood Dibbles, PA-C Authorized by: Linwood Dibbles, PA-C   Consent:    Consent obtained:  Verbal   Consent given by:  Patient   Risks, benefits, and alternatives were discussed: yes     Risks discussed:  Bleeding, infection, nasal injury and pain   Alternatives discussed:  Referral, observation, alternative treatment,  delayed treatment and no treatment Universal protocol:    Procedure explained and questions answered to patient or proxy's satisfaction: yes     Relevant documents present and verified: yes     Test results available: yes     Imaging studies available: yes     Required blood products, implants, devices, and special equipment available: yes     Site/side marked: yes     Immediately prior to procedure, a time out was called: yes     Patient identity confirmed:  Verbally with patient Anesthesia:    Anesthesia method:  None Procedure details:    Treatment site:  Unable to specify   Repair method: afrin.   Treatment complexity:  Limited   Treatment episode: initial   Post-procedure details:    Assessment:  Bleeding stopped   Procedure completion:  Tolerated well, no immediate complications     Medications Ordered in ED Medications  oxymetazoline (AFRIN) 0.05 % nasal spray 1 spray (1 spray Each Nare Given 03/30/23 2224)   ED Course/ Medical Decision Making/ A&P   27 year old here for evaluation of shortness of breath, began 30 minutes PTA.  No recent injury or trauma.  No anticoagulation.  No history of anemia, thrombocytosis. Does have some slow oozing on exam.  Patient given Afrin here.  Will check CBC due to recurrent epistaxis.  Labs and imaging personally viewed and interpreted:  CBC without anemia, thrombocytopenia  Patient reassessed.  No further epistaxis.  He did states his symptoms had improved when I saw him previously.  Will Rx for similar.  I encouraged him to follow-up care with PCP.  He is agreeable.  The patient has been appropriately medically screened and/or stabilized in the ED. I have low suspicion for any other emergent medical condition which would require further screening, evaluation or treatment in the ED or require inpatient management.  Patient is hemodynamically stable and in no acute distress.  Patient able to ambulate in department prior to ED.   Evaluation does not show acute pathology that would require ongoing or additional emergent interventions while in the emergency department or further inpatient treatment.  I have discussed the diagnosis with the patient and answered all questions.  Pain is been managed while in the emergency department and patient has no further complaints prior to discharge.  Patient is comfortable with plan discussed in room and is stable for discharge at this time.  I have discussed strict return precautions for returning to the emergency department.  Patient was encouraged to follow-up with PCP/specialist refer to at discharge.                                 Medical Decision Making Amount and/or Complexity  of Data Reviewed Independent Historian: EMS External Data Reviewed: labs and notes. Labs: ordered. Decision-making details documented in ED Course.  Risk OTC drugs. Prescription drug management. Decision regarding hospitalization. Diagnosis or treatment significantly limited by social determinants of health.          Final Clinical Impression(s) / ED Diagnoses Final diagnoses:  Epistaxis    Rx / DC Orders ED Discharge Orders          Ordered    cetirizine (ZYRTEC ALLERGY) 10 MG tablet  Daily        03/30/23 2255    oxymetazoline (AFRIN NASAL SPRAY) 0.05 % nasal spray  2 times daily        03/30/23 2255    saline (AYR) GEL  Every 4 hours PRN        03/30/23 2255              Mushka Laconte A, PA-C 03/30/23 2258    Melene Plan, DO 03/31/23 2351

## 2023-04-30 ENCOUNTER — Encounter (HOSPITAL_BASED_OUTPATIENT_CLINIC_OR_DEPARTMENT_OTHER): Payer: Self-pay | Admitting: Emergency Medicine

## 2023-04-30 ENCOUNTER — Emergency Department (HOSPITAL_BASED_OUTPATIENT_CLINIC_OR_DEPARTMENT_OTHER)
Admission: EM | Admit: 2023-04-30 | Discharge: 2023-04-30 | Disposition: A | Discharge status: Home / Self Care | Payer: Medicaid Other | Attending: Emergency Medicine | Admitting: Emergency Medicine

## 2023-04-30 DIAGNOSIS — Z87891 Personal history of nicotine dependence: Secondary | ICD-10-CM | POA: Diagnosis not present

## 2023-04-30 DIAGNOSIS — J45909 Unspecified asthma, uncomplicated: Secondary | ICD-10-CM | POA: Diagnosis not present

## 2023-04-30 DIAGNOSIS — R040 Epistaxis: Secondary | ICD-10-CM | POA: Insufficient documentation

## 2023-04-30 DIAGNOSIS — Z7951 Long term (current) use of inhaled steroids: Secondary | ICD-10-CM | POA: Insufficient documentation

## 2023-04-30 HISTORY — DX: Epistaxis: R04.0

## 2023-04-30 MED ORDER — LIDOCAINE-EPINEPHRINE (PF) 2 %-1:200000 IJ SOLN
10.0000 mL | Freq: Once | INTRAMUSCULAR | Status: AC
  Administered 2023-04-30: 10 mL
  Filled 2023-04-30: qty 20

## 2023-04-30 MED ORDER — ALBUTEROL SULFATE HFA 108 (90 BASE) MCG/ACT IN AERS: 1.0000 | INHALATION_SPRAY | Freq: Four times a day (QID) | RESPIRATORY_TRACT | 0 refills | Status: DC | PRN

## 2023-04-30 NOTE — ED Provider Notes (Signed)
[facility] [facility] Provider Note   CSN: [id number] Arrival date & time: 04/30/23  1421     History  Chief Complaint  Patient presents with   Epistaxis    Gavin Riley is a 27 y.o. male.   Epistaxis   27 year old male presents emergency department with complaints of nosebleed.  Patient states that he has been having right-sided nosebleed around the past 45 minutes or so.  States he was just walking around the park when it began to bleed.  States has a history of recurrent nosebleeds and is always on the right.  States that he tried to use Afrin at home without improvement.  States he was told to follow-up with ENT but has not scheduled an appointment.  Denies any trauma to nose.  Denies any blood thinner use.  Past medical history significant for asthma, eczema, nosebleed  Home Medications Prior to Admission medications   Medication Sig Start Date End Date Taking? Authorizing Provider  albuterol (VENTOLIN HFA) 108 (90 Base) MCG/ACT inhaler Inhale 1-2 puffs into the lungs every 6 (six) hours as needed for wheezing or shortness of breath. 04/30/23  Yes Sherian Maroon A, PA  budesonide-formoterol (SYMBICORT) 80-4.5 MCG/ACT inhaler Inhale 2 puffs into the lungs 2 (two) times daily. 03/21/20   Bing Neighbors, NP  cetirizine (ZYRTEC ALLERGY) 10 MG tablet Take 1 tablet (10 mg total) by mouth daily. 03/30/23   Henderly, Britni A, PA-C  oxymetazoline (AFRIN NASAL SPRAY) 0.05 % nasal spray Place 1 spray into both nostrils 2 (two) times daily. 03/30/23   Henderly, Britni A, PA-C  saline (AYR) GEL Place 1 Application into both nostrils every 4 (four) hours as needed. 03/30/23   Henderly, Britni A, PA-C  triamcinolone cream (KENALOG) 0.1 % Apply 1 application topically 2 (two) times daily. 01/14/18   Cristina Gong, PA-C      Allergies    Shellfish allergy    Review of Systems   Review of Systems  HENT:  Positive for nosebleeds.   All  other systems reviewed and are negative.   Physical Exam Updated Vital Signs BP 127/85 (BP Location: Right Arm)   Pulse 90   Temp 99 F (37.2 C)   Resp 18   Ht 5\' 11"  (1.803 m)   Wt 90.7 kg   SpO2 94%   BMI 27.89 kg/m  Physical Exam Vitals and nursing note reviewed.  Constitutional:      General: He is not in acute distress.    Appearance: He is well-developed.  HENT:     Head: Normocephalic and atraumatic.     Nose:     Comments: Large clots appreciated bilateral nare.  Oozing blood appreciated in right nare around clot Eyes:     Conjunctiva/sclera: Conjunctivae normal.  Cardiovascular:     Rate and Rhythm: Normal rate and regular rhythm.     Heart sounds: No murmur heard. Pulmonary:     Effort: Pulmonary effort is normal. No respiratory distress.     Breath sounds: Normal breath sounds.  Abdominal:     Palpations: Abdomen is soft.     Tenderness: There is no abdominal tenderness.  Musculoskeletal:        General: No swelling.     Cervical back: Neck supple.  Skin:    General: Skin is warm and dry.     Capillary Refill: Capillary refill takes less than 2 seconds.  Neurological:     Mental Status: He is alert.  Psychiatric:        Mood and Affect: Mood normal.     ED Results / Procedures / Treatments   Labs (all labs ordered are listed, but only abnormal results are displayed) Labs Reviewed - No data to display  EKG None  Radiology No results found.  Procedures .Epistaxis Management  Date/Time: 04/30/2023 3:43 PM  Performed by: Peter Garter, PA Authorized by: Peter Garter, PA   Consent:    Consent obtained:  Verbal   Consent given by:  Patient   Risks, benefits, and alternatives were discussed: yes     Risks discussed:  Bleeding   Alternatives discussed:  No treatment Universal protocol:    Procedure explained and questions answered to patient or proxy's satisfaction: yes     Patient identity confirmed:  Verbally with  patient Anesthesia:    Anesthesia method:  Topical application   Topical anesthetic:  Epinephrine and lidocaine gel Procedure details:    Treatment site:  L anterior   Treatment method:  Anterior pack   Treatment complexity:  Limited   Treatment episode: initial   Post-procedure details:    Assessment:  Bleeding stopped   Procedure completion:  Tolerated well, no immediate complications     Medications Ordered in ED Medications  lidocaine-EPINEPHrine (XYLOCAINE W/EPI) 2 %-1:200000 (PF) injection 10 mL (10 mLs Infiltration Given by Other 04/30/23 1503)    ED Course/ Medical Decision Making/ A&P                                 Medical Decision Making Risk Prescription drug management.   This patient presents to the ED for concern of nosebleed, this involves an extensive number of treatment options, and is a complaint that carries with it a high risk of complications and morbidity.  The differential diagnosis includes anterior/posterior nosebleed   Co morbidities that complicate the patient evaluation  See HPI   Additional history obtained:  Additional history obtained from EMR External records from outside source obtained and reviewed including hospital records   Lab Tests:  N/a   Imaging Studies ordered:  N/a   Cardiac Monitoring: / EKG:  The patient was maintained on a cardiac monitor.  I personally viewed and interpreted the cardiac monitored which showed an underlying rhythm of: Sinus rhythm   Consultations Obtained:  N/a   Problem List / ED Course / Critical interventions / Medication management  Epistaxis I ordered medication including lidocaine with epinephrine  Reevaluation of the patient after these medicines showed that the patient improved I have reviewed the patients home medicines and have made adjustments as needed   Social Determinants of Health:  Former cigarette use.  Denies illicit drug use   Test / Admission -  Considered:  Epistaxis Vitals signs within normal range and stable throughout visit. 27 year old male presents emergency department with complaints of nosebleed.  Nosebleed began 30 minutes prior to arrival.  Patient has history of right sided nosebleed that is now happened 3 times in the last 3 months.  Has not followed with ENT in the outpatient setting.  On exam, patient with evidence of bleeding from right anterior nare on septum.  Patient treated with anterior packing with lidocaine with epinephrine.  Resolution of nosebleed with 30 minutes of observation post packing.  Will recommend follow-up with ENT in the outpatient setting for reassessment given patient's recurrent nosebleeds. Treatment plan discussed at length with patient and he  acknowledged understanding was agreeable to said plan.  Patient overall well-appearing, afebrile in no acute distress. Worrisome signs and symptoms were discussed with the patient, and the patient acknowledged understanding to return to the ED if noticed. Patient was stable upon discharge.          Final Clinical Impression(s) / ED Diagnoses Final diagnoses:  Epistaxis    Rx / DC Orders ED Discharge Orders          Ordered    albuterol (VENTOLIN HFA) 108 (90 Base) MCG/ACT inhaler  Every 6 hours PRN        04/30/23 1604    Ambulatory referral to ENT       Comments: Recurrent epistaxis R   04/30/23 1605              Peter Garter, [place] 04/30/23 1609    Glyn Ade, MD 04/30/23 2050

## 2023-04-30 NOTE — Discharge Instructions (Addendum)
As discussed, we will refer you to ENT given your recurrent right-sided nosebleeds.  Please pinch her nose acute cough, sneeze do not blow your nose until area heals.  If begins to bleed again, start cottonball and Afrin in place in the right nostril as we did in emergency department.  This should help stop the bleeding.  Please do not hesitate to return to the emergency department if there are worrisome signs and symptoms we discussed become apparent.

## 2023-04-30 NOTE — ED Triage Notes (Signed)
Pt with nosebleed x 30 min (RT nare); hx of same; used Afrin x 2 to RT nare PTA

## 2023-05-02 ENCOUNTER — Encounter (INDEPENDENT_AMBULATORY_CARE_PROVIDER_SITE_OTHER): Payer: Self-pay | Admitting: Otolaryngology

## 2023-05-17 DIAGNOSIS — J4520 Mild intermittent asthma, uncomplicated: Secondary | ICD-10-CM | POA: Insufficient documentation

## 2023-05-17 DIAGNOSIS — I10 Essential (primary) hypertension: Secondary | ICD-10-CM | POA: Insufficient documentation

## 2023-05-17 NOTE — Progress Notes (Unsigned)
New patient visit   Patient: Gavin Riley   DOB: 06-03-1996   27 y.o. Male  MRN: [id number] Visit Date: 05/23/2023  Today's healthcare provider: Alfredia Ferguson, PA-C   No chief complaint on file.  Subjective    Gavin JAHSIAH Riley is a 27 y.o. male who presents today as a new patient to establish care.   Asthma -symbicort 2 bid, albuterol, antihis  Past Medical History:  Diagnosis Date   Asthma    Eczema    Nosebleed    No past surgical history on file. No family status information on file.   Family History  Family history unknown: Yes   Social History   Socioeconomic History   Marital status: Significant Other    Spouse name: Not on file   Number of children: Not on file   Years of education: Not on file   Highest education level: Not on file  Occupational History   Not on file  Tobacco Use   Smoking status: Former    Current packs/day: 0.00    Types: Cigarettes    Quit date: 03/20/2021    Years since quitting: 2.1   Smokeless tobacco: Never  Vaping Use   Vaping status: Never Used  Substance and Sexual Activity   Alcohol use: No   Drug use: No   Sexual activity: Not on file  Other Topics Concern   Not on file  Social History Narrative   Not on file   Social Determinants of Health   Financial Resource Strain: Not on file  Food Insecurity: Not on file  Transportation Needs: Not on file  Physical Activity: Not on file  Stress: Not on file  Social Connections: Not on file   Outpatient Medications Prior to Visit  Medication Sig   albuterol (VENTOLIN HFA) 108 (90 Base) MCG/ACT inhaler Inhale 1-2 puffs into the lungs every 6 (six) hours as needed for wheezing or shortness of breath.   budesonide-formoterol (SYMBICORT) 80-4.5 MCG/ACT inhaler Inhale 2 puffs into the lungs 2 (two) times daily.   cetirizine (ZYRTEC ALLERGY) 10 MG tablet Take 1 tablet (10 mg total) by mouth daily.   oxymetazoline (AFRIN NASAL SPRAY) 0.05 % nasal spray Place 1  spray into both nostrils 2 (two) times daily.   saline (AYR) GEL Place 1 Application into both nostrils every 4 (four) hours as needed.   triamcinolone cream (KENALOG) 0.1 % Apply 1 application topically 2 (two) times daily.   No facility-administered medications prior to visit.   Allergies  Allergen Reactions   Shellfish Allergy Other (See Comments)    Immunization History  Administered Date(s) Administered   DTaP 09/02/1997, 10/29/1997, 01/07/1998, 03/08/1999, 04/01/2002   HIB (PRP-OMP) 09/02/1997, 10/29/1997, 03/08/1999   HPV 9-valent 04/25/2011, 02/27/2012, 08/28/2014   Hepatitis A, Ped/Adol-2 Dose 04/25/2011, 02/27/2012   Hepatitis B, PED/ADOLESCENT 07/30/1997, 09/02/1997, 01/07/1998   Influenza-Unspecified 04/10/2007, 03/09/2009, 04/25/2011   MMR 09/18/1998, 04/01/2002   MenQuadfi_Meningococcal Groups ACYW Conjugate 04/25/2011, 08/28/2014   Td 02/21/2008, 03/09/2009   Tdap 02/21/2008, 03/09/2009, 09/14/2021   Varicella 09/18/1998, 04/25/2011    Health Maintenance  Topic Date Due   HIV Screening  Never done   Hepatitis C Screening  Never done   INFLUENZA VACCINE  01/19/2023   COVID-19 Vaccine (1 - 2023-24 season) Never done   DTaP/Tdap/Td (11 - Td or Tdap) 09/15/2031   HPV VACCINES  Completed    Patient Care Team: Patient, No Pcp Per as PCP - General (General Practice)  Review of Systems  {  Insert previous labs (optional):23779} {See past labs  Heme  Chem  Endocrine  Serology  Results Review (optional):1}   Objective    There were no vitals taken for this visit. {Insert last BP/Wt (optional):23777}{See vitals history (optional):1}   Physical Exam ***  Depression Screen     No data to display         No results found for any visits on 05/23/23.  Assessment & Plan     There are no diagnoses linked to this encounter.   No follow-ups on file.      Alfredia Ferguson, PA-C  [facility] [phone]  (phone) [phone] (fax)  [facility]

## 2023-05-23 ENCOUNTER — Ambulatory Visit: Payer: Medicaid Other | Admitting: Physician Assistant

## 2023-09-30 ENCOUNTER — Encounter (HOSPITAL_BASED_OUTPATIENT_CLINIC_OR_DEPARTMENT_OTHER): Payer: Self-pay | Admitting: Emergency Medicine

## 2023-09-30 ENCOUNTER — Emergency Department (HOSPITAL_BASED_OUTPATIENT_CLINIC_OR_DEPARTMENT_OTHER)
Admission: EM | Admit: 2023-09-30 | Discharge: 2023-10-01 | Disposition: A | Discharge status: Home / Self Care | Attending: Emergency Medicine | Admitting: Emergency Medicine

## 2023-09-30 ENCOUNTER — Emergency Department (HOSPITAL_BASED_OUTPATIENT_CLINIC_OR_DEPARTMENT_OTHER)

## 2023-09-30 ENCOUNTER — Other Ambulatory Visit: Payer: Self-pay

## 2023-09-30 DIAGNOSIS — I10 Essential (primary) hypertension: Secondary | ICD-10-CM | POA: Insufficient documentation

## 2023-09-30 DIAGNOSIS — Z7951 Long term (current) use of inhaled steroids: Secondary | ICD-10-CM | POA: Insufficient documentation

## 2023-09-30 DIAGNOSIS — J45909 Unspecified asthma, uncomplicated: Secondary | ICD-10-CM

## 2023-09-30 DIAGNOSIS — Z79899 Other long term (current) drug therapy: Secondary | ICD-10-CM | POA: Insufficient documentation

## 2023-09-30 DIAGNOSIS — R091 Pleurisy: Secondary | ICD-10-CM

## 2023-09-30 DIAGNOSIS — Z87891 Personal history of nicotine dependence: Secondary | ICD-10-CM | POA: Diagnosis not present

## 2023-09-30 DIAGNOSIS — R079 Chest pain, unspecified: Secondary | ICD-10-CM | POA: Diagnosis present

## 2023-09-30 DIAGNOSIS — J4530 Mild persistent asthma, uncomplicated: Secondary | ICD-10-CM | POA: Diagnosis not present

## 2023-09-30 LAB — RESP PANEL BY RT-PCR (RSV, FLU A&B, COVID)  RVPGX2
Influenza A by PCR: NEGATIVE
Influenza B by PCR: NEGATIVE
Resp Syncytial Virus by PCR: NEGATIVE
SARS Coronavirus 2 by RT PCR: NEGATIVE

## 2023-09-30 LAB — CBC WITH DIFFERENTIAL/PLATELET
Abs Immature Granulocytes: 0.02 10*3/uL (ref 0.00–0.07)
Basophils Absolute: 0.1 10*3/uL (ref 0.0–0.1)
Basophils Relative: 1 %
Eosinophils Absolute: 0.1 10*3/uL (ref 0.0–0.5)
Eosinophils Relative: 2 %
HCT: 46.2 % (ref 39.0–52.0)
Hemoglobin: 15.8 g/dL (ref 13.0–17.0)
Immature Granulocytes: 0 %
Lymphocytes Relative: 39 %
Lymphs Abs: 3.2 10*3/uL (ref 0.7–4.0)
MCH: 30.6 pg (ref 26.0–34.0)
MCHC: 34.2 g/dL (ref 30.0–36.0)
MCV: 89.4 fL (ref 80.0–100.0)
Monocytes Absolute: 0.6 10*3/uL (ref 0.1–1.0)
Monocytes Relative: 8 %
Neutro Abs: 4.1 10*3/uL (ref 1.7–7.7)
Neutrophils Relative %: 50 %
Platelets: 269 10*3/uL (ref 150–400)
RBC: 5.17 MIL/uL (ref 4.22–5.81)
RDW: 15 % (ref 11.5–15.5)
WBC: 8.1 10*3/uL (ref 4.0–10.5)
nRBC: 0 % (ref 0.0–0.2)

## 2023-09-30 LAB — COMPREHENSIVE METABOLIC PANEL WITH GFR
ALT: 30 U/L (ref 0–44)
AST: 23 U/L (ref 15–41)
Albumin: 4.1 g/dL (ref 3.5–5.0)
Alkaline Phosphatase: 80 U/L (ref 38–126)
Anion gap: 9 (ref 5–15)
BUN: 12 mg/dL (ref 6–20)
CO2: 26 mmol/L (ref 22–32)
Calcium: 9.4 mg/dL (ref 8.9–10.3)
Chloride: 103 mmol/L (ref 98–111)
Creatinine, Ser: 1 mg/dL (ref 0.61–1.24)
GFR, Estimated: >60 mL/min (ref >60)
Glucose, Bld: 72 mg/dL (ref 70–99)
Potassium: 4.1 mmol/L (ref 3.5–5.1)
Sodium: 138 mmol/L (ref 135–145)
Total Bilirubin: 0.6 mg/dL (ref 0.0–1.2)
Total Protein: 7.9 g/dL (ref 6.5–8.1)

## 2023-09-30 LAB — TROPONIN I (HIGH SENSITIVITY): Troponin I (High Sensitivity): 4 ng/L (ref <18)

## 2023-09-30 NOTE — ED Triage Notes (Signed)
 Pt reports sharp intermittent LT CP x 3 days; cough x 2 wks; had some pain in LUE as well

## 2023-10-01 MED ORDER — GUAIFENESIN-DM 100-10 MG/5ML PO SYRP: 5.0000 mL | ORAL_SOLUTION | ORAL | 0 refills | Status: AC | PRN

## 2023-10-01 MED ORDER — ALBUTEROL SULFATE HFA 108 (90 BASE) MCG/ACT IN AERS: 1.0000 | INHALATION_SPRAY | RESPIRATORY_TRACT | 1 refills | Status: DC | PRN

## 2023-10-01 MED ORDER — LIDOCAINE 5 % EX PTCH: 1.0000 | MEDICATED_PATCH | Freq: Every day | CUTANEOUS | 0 refills | Status: AC | PRN

## 2023-10-01 MED ORDER — IBUPROFEN 600 MG PO TABS: 600.0000 mg | ORAL_TABLET | Freq: Four times a day (QID) | ORAL | 0 refills | Status: AC | PRN

## 2023-10-01 MED ORDER — ALBUTEROL SULFATE HFA 108 (90 BASE) MCG/ACT IN AERS
1.0000 | INHALATION_SPRAY | RESPIRATORY_TRACT | Status: DC
  Administered 2023-10-01: 2 via RESPIRATORY_TRACT
  Filled 2023-10-01: qty 6.7

## 2023-10-01 MED ORDER — CYCLOBENZAPRINE HCL 10 MG PO TABS: 10.0000 mg | ORAL_TABLET | Freq: Two times a day (BID) | ORAL | 0 refills | Status: AC | PRN

## 2023-10-01 MED ORDER — ACETAMINOPHEN 325 MG PO TABS: 650.0000 mg | ORAL_TABLET | Freq: Four times a day (QID) | ORAL | 0 refills | Status: AC | PRN

## 2023-10-01 NOTE — ED Provider Notes (Signed)
 [place] [facility] Provider Note  CSN: [id number] Arrival date & time: 09/30/23 2214  Chief Complaint(s) Chest Pain  HPI Gavin Riley is a 28 y.o. male with past medical history as below, significant for asthma, eczema, hypertension who presents to the ED with complaint of chest pain, dyspnea, cough  Left-sided chest pain ongoing over the past 3 to 4 days.  Been having cough over the past week.  Chest pain worsened with coughing, torso movement or arm movement.  worse with deep inspiration.  No chest pain at rest.  Has a little bit of wheezing, ran out of his inhaler.  Does not follow PCP regularly.  No significant difficulty breathing over the past few days.  No fevers or chills, no nausea or vomiting.  No rashes.  No recent travel or sick contacts.  Past Medical History Past Medical History:  Diagnosis Date   Asthma    Eczema    Nosebleed    Patient Active Problem List   Diagnosis Date Noted   Essential hypertension 05/17/2023   Mild intermittent asthma 05/17/2023   Home Medication(s) Prior to Admission medications   Medication Sig Start Date End Date Taking? Authorizing Provider  acetaminophen (TYLENOL) 325 MG tablet Take 2 tablets (650 mg total) by mouth every 6 (six) hours as needed. 10/01/23  Yes Russella Courts A, DO  albuterol (VENTOLIN HFA) 108 (90 Base) MCG/ACT inhaler Inhale 1-2 puffs into the lungs every 4 (four) hours as needed for wheezing or shortness of breath. 10/01/23  Yes Russella Courts A, DO  cyclobenzaprine (FLEXERIL) 10 MG tablet Take 1 tablet (10 mg total) by mouth 2 (two) times daily as needed for muscle spasms. 10/01/23  Yes Russella Courts A, DO  guaiFENesin-dextromethorphan (ROBITUSSIN DM) 100-10 MG/5ML syrup Take 5 mLs by mouth every 4 (four) hours as needed for cough. 10/01/23  Yes Russella Courts A, DO  ibuprofen (ADVIL) 600 MG tablet Take 1 tablet (600 mg total) by mouth every 6 (six) hours as needed. 10/01/23  Yes Russella Courts A, DO  lidocaine (LIDODERM) 5 % Place 1 patch onto the skin daily as needed. Remove & Discard patch within 12 hours or as directed by MD 10/01/23  Yes Russella Courts A, DO  albuterol (VENTOLIN HFA) 108 (90 Base) MCG/ACT inhaler Inhale 1-2 puffs into the lungs every 6 (six) hours as needed for wheezing or shortness of breath. 04/30/23   Carrollwood Butter, PA  budesonide-formoterol (SYMBICORT) 80-4.5 MCG/ACT inhaler Inhale 2 puffs into the lungs 2 (two) times daily. 03/21/20   Buena Carmine, NP  cetirizine (ZYRTEC ALLERGY) 10 MG tablet Take 1 tablet (10 mg total) by mouth daily. 03/30/23   Henderly, Britni A, PA-C  oxymetazoline (AFRIN NASAL SPRAY) 0.05 % nasal spray Place 1 spray into both nostrils 2 (two) times daily. 03/30/23   Henderly, Britni A, PA-C  saline (AYR) GEL Place 1 Application into both nostrils every 4 (four) hours as needed. 03/30/23   Henderly, Britni A, PA-C  triamcinolone cream (KENALOG) 0.1 % Apply 1 application topically 2 (two) times daily. 01/14/18   Dwain Giovanni, PA-C  Past Surgical History History reviewed. No pertinent surgical history. Family History Family History  Family history unknown: Yes    Social History Social History   Tobacco Use   Smoking status: Former    Current packs/day: 0.00    Types: Cigarettes    Quit date: 03/20/2021    Years since quitting: 2.5   Smokeless tobacco: Never  Vaping Use   Vaping status: Never Used  Substance Use Topics   Alcohol use: No   Drug use: No   Allergies Shellfish allergy  Review of Systems A thorough review of systems was obtained and all systems are negative except as noted in the HPI and PMH.   Physical Exam Vital Signs  I have reviewed the triage vital signs BP 139/87 (BP Location: Right Arm)   Pulse 89   Temp 98.1 F (36.7 C)   Resp 18   Ht 5\' 11"  (1.803 m)    Wt 113.4 kg   SpO2 100%   BMI 34.87 kg/m  Physical Exam Vitals and nursing note reviewed.  Constitutional:      General: He is not in acute distress.    Appearance: He is well-developed.  HENT:     Head: Normocephalic and atraumatic.     Right Ear: External ear normal.     Left Ear: External ear normal.     Mouth/Throat:     Mouth: Mucous membranes are moist.  Eyes:     General: No scleral icterus. Cardiovascular:     Rate and Rhythm: Normal rate and regular rhythm.     Pulses: Normal pulses.     Heart sounds: Normal heart sounds.  Pulmonary:     Effort: Pulmonary effort is normal. No respiratory distress.     Breath sounds: Wheezing present.     Comments: Trace wheeze  Chest:    Abdominal:     General: Abdomen is flat.     Palpations: Abdomen is soft.     Tenderness: There is no abdominal tenderness.  Musculoskeletal:     Cervical back: No rigidity.     Right lower leg: No edema.     Left lower leg: No edema.  Skin:    General: Skin is warm and dry.     Capillary Refill: Capillary refill takes less than 2 seconds.  Neurological:     Mental Status: He is alert.  Psychiatric:        Mood and Affect: Mood normal.        Behavior: Behavior normal.     ED Results and Treatments Labs (all labs ordered are listed, but only abnormal results are displayed) Labs Reviewed  RESP PANEL BY RT-PCR (RSV, FLU A&B, COVID)  RVPGX2  CBC WITH DIFFERENTIAL/PLATELET  COMPREHENSIVE METABOLIC PANEL WITH GFR  TROPONIN I (HIGH SENSITIVITY)  TROPONIN I (HIGH SENSITIVITY)  Radiology DG Chest 2 View Result Date: 09/30/2023 CLINICAL DATA:  Left-sided chest pain for several days with cough EXAM: CHEST - 2 VIEW COMPARISON:  09/30/2020 FINDINGS: The heart size and mediastinal contours are within normal limits. Both lungs are clear. The visualized skeletal structures  are unremarkable. IMPRESSION: No active cardiopulmonary disease. Electronically Signed   By: Alcide Clever M.D.   On: 09/30/2023 22:39    Pertinent labs & imaging results that were available during my care of the patient were reviewed by me and considered in my medical decision making (see MDM for details).  Medications Ordered in ED Medications  albuterol (VENTOLIN HFA) 108 (90 Base) MCG/ACT inhaler 1-2 puff (has no administration in time range)                                                                                                                                     Procedures Procedures  (including critical care time)  Medical Decision Making / ED Course    Medical Decision Making:    RONNELL CLINGER is a 28 y.o. male with past medical history as below, significant for asthma, eczema, hypertension who presents to the ED with complaint of chest pain, dyspnea, cough. The complaint involves an extensive differential diagnosis and also carries with it a high risk of complications and morbidity.  Serious etiology was considered. Ddx includes but is not limited to: Differential includes all life-threatening causes for chest pain. This includes but is not exclusive to acute coronary syndrome, aortic dissection, pulmonary embolism, cardiac tamponade, community-acquired pneumonia, pericarditis, musculoskeletal chest wall pain, etc.   Complete initial physical exam performed, notably the patient was in no distress .    Reviewed and confirmed nursing documentation for past medical history, family history, social history.  Vital signs reviewed.      Brief summary: 28 year old male history as above here with left-sided upper chest wall pain, slight wheezing, cough. HDS.  No hypoxia Labs reviewed, these are stable, chest x-ray stable. Trace wheeze, give albuterol inhaler, no hypoxia.  Has history of asthma, he has no significant difficulty breathing, does not appear to have an acute  exacerbation.  The patient's chest pain is not suggestive of pulmonary embolus, cardiac ischemia, aortic dissection, pericarditis, myocarditis, pulmonary embolism, pneumothorax, pneumonia, Zoster, or esophageal perforation, or other serious etiology.  Historically not abrupt in onset, tearing or ripping, pulses symmetric. EKG nonspecific for ischemia/infarction. No dysrhythmias, brugada, WPW, prolonged QT noted.   Troponin negative x1 (pain x3 days). CXR reviewed. Labs without demonstration of acute pathology unless otherwise noted above. Low HEART Score: 0-3 points (0.9-1.7% risk of MACE).]   Given the extremely low risk of these diagnoses further testing and evaluation for these possibilities does not appear to be indicated at this time. Patient in no distress and overall condition improved here in the ED. Detailed discussions were had with the patient regarding current findings, and need for close  f/u with PCP or on call doctor. The patient has been instructed to return immediately if the symptoms worsen in any way for re-evaluation. Patient verbalized understanding and is in agreement with current care plan. All questions answered prior to discharge.                 Additional history obtained: -Additional history obtained from spouse -External records from outside source obtained and reviewed including: Chart review including previous notes, labs, imaging, consultation notes including  Prior ER visits, home medications   Lab Tests: -I ordered, reviewed, and interpreted labs.   The pertinent results include:   Labs Reviewed  RESP PANEL BY RT-PCR (RSV, FLU A&B, COVID)  RVPGX2  CBC WITH DIFFERENTIAL/PLATELET  COMPREHENSIVE METABOLIC PANEL WITH GFR  TROPONIN I (HIGH SENSITIVITY)  TROPONIN I (HIGH SENSITIVITY)    Notable for labs stable  EKG   EKG Interpretation Date/Time:  Saturday September 30 2023 22:22:01 EDT Ventricular Rate:  87 PR Interval:  162 QRS Duration:  90 QT  Interval:  362 QTC Calculation: 435 R Axis:   96  Text Interpretation: Normal sinus rhythm Rightward axis Borderline ECG When compared with ECG of 30-Sep-2020 00:19, PREVIOUS ECG IS PRESENT Confirmed by Russella Courts ([id number]) on 09/30/2023 11:21:28 PM         Imaging Studies ordered: I ordered imaging studies including chest x-ray I independently visualized the following imaging with scope of interpretation limited to determining acute life threatening conditions related to emergency care; findings noted above I independently visualized and interpreted imaging. I agree with the radiologist interpretation   Medicines ordered and prescription drug management: Meds ordered this encounter  Medications   albuterol (VENTOLIN HFA) 108 (90 Base) MCG/ACT inhaler 1-2 puff   cyclobenzaprine (FLEXERIL) 10 MG tablet    Sig: Take 1 tablet (10 mg total) by mouth 2 (two) times daily as needed for muscle spasms.    Dispense:  20 tablet    Refill:  0   acetaminophen (TYLENOL) 325 MG tablet    Sig: Take 2 tablets (650 mg total) by mouth every 6 (six) hours as needed.    Dispense:  36 tablet    Refill:  0   ibuprofen (ADVIL) 600 MG tablet    Sig: Take 1 tablet (600 mg total) by mouth every 6 (six) hours as needed.    Dispense:  30 tablet    Refill:  0   lidocaine (LIDODERM) 5 %    Sig: Place 1 patch onto the skin daily as needed. Remove & Discard patch within 12 hours or as directed by MD    Dispense:  15 patch    Refill:  0   albuterol (VENTOLIN HFA) 108 (90 Base) MCG/ACT inhaler    Sig: Inhale 1-2 puffs into the lungs every 4 (four) hours as needed for wheezing or shortness of breath.    Dispense:  1 each    Refill:  1   guaiFENesin-dextromethorphan (ROBITUSSIN DM) 100-10 MG/5ML syrup    Sig: Take 5 mLs by mouth every 4 (four) hours as needed for cough.    Dispense:  118 mL    Refill:  0    -I have reviewed the patients home medicines and have made adjustments as needed   Consultations  Obtained: na   Cardiac Monitoring: The patient was maintained on a cardiac monitor.  I personally viewed and interpreted the cardiac monitored which showed an underlying rhythm of: nsr Continuous pulse oximetry interpreted by myself, 100% on RA.  Social Determinants of Health:  Diagnosis or treatment significantly limited by social determinants of health: former smoker and obesity   Reevaluation: After the interventions noted above, I reevaluated the patient and found that they have improved  Co morbidities that complicate the patient evaluation  Past Medical History:  Diagnosis Date   Asthma    Eczema    Nosebleed       Dispostion: Disposition decision including need for hospitalization was considered, and patient discharged from emergency department.    Final Clinical Impression(s) / ED Diagnoses Final diagnoses:  Pleurisy  Mild asthma without complication, unspecified whether persistent        Teddi Favors, DO 10/01/23 0123

## 2023-10-01 NOTE — Discharge Instructions (Signed)
 It was a pleasure caring for you today in the emergency department.  Avoid tobacco use, avoid exposure to others smoking, avoid exposure to strong scents or odors as this can sometimes provoke an asthma exacerbation  Please return to the emergency department for any worsening or worrisome symptoms.

## 2024-04-23 ENCOUNTER — Other Ambulatory Visit: Payer: Self-pay

## 2024-04-23 ENCOUNTER — Emergency Department (HOSPITAL_COMMUNITY)

## 2024-04-23 ENCOUNTER — Emergency Department (HOSPITAL_COMMUNITY)
Admission: EM | Admit: 2024-04-23 | Discharge: 2024-04-23 | Disposition: A | Discharge status: Home / Self Care | Attending: Emergency Medicine | Admitting: Emergency Medicine

## 2024-04-23 DIAGNOSIS — J45901 Unspecified asthma with (acute) exacerbation: Secondary | ICD-10-CM | POA: Insufficient documentation

## 2024-04-23 DIAGNOSIS — R0789 Other chest pain: Secondary | ICD-10-CM | POA: Diagnosis present

## 2024-04-23 DIAGNOSIS — Z859 Personal history of malignant neoplasm, unspecified: Secondary | ICD-10-CM | POA: Diagnosis not present

## 2024-04-23 LAB — COMPREHENSIVE METABOLIC PANEL WITH GFR
ALT: 26 U/L (ref 0–44)
AST: 31 U/L (ref 15–41)
Albumin: 3.8 g/dL (ref 3.5–5.0)
Alkaline Phosphatase: 71 U/L (ref 38–126)
Anion gap: 13 (ref 5–15)
BUN: 11 mg/dL (ref 6–20)
CO2: 22 mmol/L (ref 22–32)
Calcium: 9.2 mg/dL (ref 8.9–10.3)
Chloride: 104 mmol/L (ref 98–111)
Creatinine, Ser: 1.22 mg/dL (ref 0.61–1.24)
GFR, Estimated: >60 mL/min (ref >60)
Glucose, Bld: 133 mg/dL — ABNORMAL HIGH (ref 70–99)
Potassium: 4 mmol/L (ref 3.5–5.1)
Sodium: 139 mmol/L (ref 135–145)
Total Bilirubin: 0.6 mg/dL (ref 0.0–1.2)
Total Protein: 7.3 g/dL (ref 6.5–8.1)

## 2024-04-23 LAB — CBC WITH DIFFERENTIAL/PLATELET
Abs Immature Granulocytes: 0.03 K/uL (ref 0.00–0.07)
Basophils Absolute: 0.1 K/uL (ref 0.0–0.1)
Basophils Relative: 1 %
Eosinophils Absolute: 0 K/uL (ref 0.0–0.5)
Eosinophils Relative: 1 %
HCT: 46.8 % (ref 39.0–52.0)
Hemoglobin: 15.9 g/dL (ref 13.0–17.0)
Immature Granulocytes: 0 %
Lymphocytes Relative: 9 %
Lymphs Abs: 0.8 K/uL (ref 0.7–4.0)
MCH: 30.7 pg (ref 26.0–34.0)
MCHC: 34 g/dL (ref 30.0–36.0)
MCV: 90.3 fL (ref 80.0–100.0)
Monocytes Absolute: 0.1 K/uL (ref 0.1–1.0)
Monocytes Relative: 1 %
Neutro Abs: 7.7 K/uL (ref 1.7–7.7)
Neutrophils Relative %: 88 %
Platelets: 267 K/uL (ref 150–400)
RBC: 5.18 MIL/uL (ref 4.22–5.81)
RDW: 14.3 % (ref 11.5–15.5)
WBC: 8.6 K/uL (ref 4.0–10.5)
nRBC: 0 % (ref 0.0–0.2)

## 2024-04-23 LAB — RESP PANEL BY RT-PCR (RSV, FLU A&B, COVID)  RVPGX2
Influenza A by PCR: NEGATIVE
Influenza B by PCR: NEGATIVE
Resp Syncytial Virus by PCR: NEGATIVE
SARS Coronavirus 2 by RT PCR: NEGATIVE

## 2024-04-23 LAB — TROPONIN I (HIGH SENSITIVITY): Troponin I (High Sensitivity): 3 ng/L (ref <18)

## 2024-04-23 MED ORDER — ALBUTEROL SULFATE HFA 108 (90 BASE) MCG/ACT IN AERS
1.0000 | INHALATION_SPRAY | RESPIRATORY_TRACT | 1 refills | Status: AC | PRN
Start: 2024-04-23 — End: ?

## 2024-04-23 MED ORDER — ALBUTEROL SULFATE (2.5 MG/3ML) 0.083% IN NEBU
10.0000 mg/h | INHALATION_SOLUTION | Freq: Once | RESPIRATORY_TRACT | Status: AC
  Administered 2024-04-23: 10 mg/h via RESPIRATORY_TRACT
  Filled 2024-04-23: qty 12

## 2024-04-23 MED ORDER — BUDESONIDE-FORMOTEROL FUMARATE 80-4.5 MCG/ACT IN AERO: 2.0000 | INHALATION_SPRAY | Freq: Two times a day (BID) | RESPIRATORY_TRACT | 0 refills | Status: AC

## 2024-04-23 MED ORDER — ALBUTEROL SULFATE HFA 108 (90 BASE) MCG/ACT IN AERS
2.0000 | INHALATION_SPRAY | Freq: Once | RESPIRATORY_TRACT | Status: AC
  Administered 2024-04-23: 2 via RESPIRATORY_TRACT
  Filled 2024-04-23: qty 6.7

## 2024-04-23 MED ORDER — PREDNISONE 20 MG PO TABS: 40.0000 mg | ORAL_TABLET | Freq: Every day | ORAL | 0 refills | Status: AC

## 2024-04-23 NOTE — Medical Student Note (Incomplete)
 [facility]-EMERGENCY DEPT Provider Student Note For educational purposes for Medical, PA and NP students only and not part of the legal medical record.   CSN: [id number] Arrival date & time: 04/23/24  0425      History   Chief Complaint Chief Complaint  Patient presents with  . Shortness of Breath    HPI [name] is a 28 y.o. male with PMH of mild intermittent asthma presents to the ED with complains of chest tightness and SOB that started yesterday afternoon and have been progressively getting worse since. Pt states that he gets short of breath with deep breath. He denies chest pain, hemoptysis, dizziness, N/V/D, recent illness. He states that he had smoked vape beforehand. Pt used his albuterol  5 times and due neb 2 times without any help. Pt denies any illicit drugs and alcohol use.    Shortness of Breath   Past Medical History:  Diagnosis Date  . Asthma   . Eczema   . Nosebleed     Patient Active Problem List   Diagnosis Date Noted  . Essential hypertension 05/17/2023  . Mild intermittent asthma 05/17/2023    No past surgical history on file.     Home Medications    Prior to Admission medications   Medication Sig Start Date End Date Taking? Authorizing Provider  acetaminophen  (TYLENOL ) 325 MG tablet Take 2 tablets (650 mg total) by mouth every 6 (six) hours as needed. 10/01/23   Elnor Jayson LABOR, DO  albuterol  (VENTOLIN  HFA) 108 (90 Base) MCG/ACT inhaler Inhale 1-2 puffs into the lungs every 6 (six) hours as needed for wheezing or shortness of breath. 04/30/23   Silver Wonda LABOR, PA  albuterol  (VENTOLIN  HFA) 108 (90 Base) MCG/ACT inhaler Inhale 1-2 puffs into the lungs every 4 (four) hours as needed for wheezing or shortness of breath. 10/01/23   Elnor Jayson LABOR, DO  budesonide -formoterol  (SYMBICORT ) 80-4.5 MCG/ACT inhaler Inhale 2 puffs into the lungs 2 (two) times daily. 03/21/20   Arloa Suzen GORMAN, NP  cetirizine  (ZYRTEC  ALLERGY) 10 MG tablet Take 1 tablet  (10 mg total) by mouth daily. 03/30/23   Henderly, Britni A, PA-C  cyclobenzaprine  (FLEXERIL ) 10 MG tablet Take 1 tablet (10 mg total) by mouth 2 (two) times daily as needed for muscle spasms. 10/01/23   Elnor Jayson LABOR, DO  guaiFENesin -dextromethorphan (ROBITUSSIN DM) 100-10 MG/5ML syrup Take 5 mLs by mouth every 4 (four) hours as needed for cough. 10/01/23   Elnor Jayson LABOR, DO  ibuprofen  (ADVIL ) 600 MG tablet Take 1 tablet (600 mg total) by mouth every 6 (six) hours as needed. 10/01/23   Elnor Jayson LABOR, DO  lidocaine  (LIDODERM ) 5 % Place 1 patch onto the skin daily as needed. Remove & Discard patch within 12 hours or as directed by MD 10/01/23   Elnor Jayson LABOR, DO  oxymetazoline  (AFRIN NASAL SPRAY) 0.05 % nasal spray Place 1 spray into both nostrils 2 (two) times daily. 03/30/23   Henderly, Britni A, PA-C  saline (AYR) GEL Place 1 Application into both nostrils every 4 (four) hours as needed. 03/30/23   Henderly, Britni A, PA-C  triamcinolone  cream (KENALOG ) 0.1 % Apply 1 application topically 2 (two) times daily. 01/14/18   Windle Almarie ORN, PA-C    Family History Family History  Family history unknown: Yes    Social History Social History   Tobacco Use  . Smoking status: Former    Current packs/day: 0.00    Types: Cigarettes    Quit  date: 03/20/2021    Years since quitting: 3.0  . Smokeless tobacco: Never  Vaping Use  . Vaping status: Never Used  Substance Use Topics  . Alcohol use: No  . Drug use: No     Allergies   Shellfish allergy   Review of Systems Review of Systems  Respiratory:  Positive for shortness of breath.      Physical Exam Updated Vital Signs BP (!) 146/95   Pulse 73   Temp 98.2 F (36.8 C) (Oral)   Resp 20   SpO2 97%   Physical Exam   ED Treatments / Results  Labs (all labs ordered are listed, but only abnormal results are displayed) Labs Reviewed  RESP PANEL BY RT-PCR (RSV, FLU A&B, COVID)  RVPGX2  CBC WITH DIFFERENTIAL/PLATELET   COMPREHENSIVE METABOLIC PANEL WITH GFR  TROPONIN I (HIGH SENSITIVITY)    EKG  Radiology DG Chest 2 View Result Date: 04/23/2024 EXAM: 2 VIEW(S) XRAY OF THE CHEST 04/23/2024 05:08:00 AM COMPARISON: 09/30/2023 CLINICAL HISTORY: SOB FINDINGS: LUNGS AND PLEURA: Bronchial cuffing within the right perihilar region identified. No focal pulmonary opacity. No pulmonary edema. No pleural effusion. No pneumothorax. HEART AND MEDIASTINUM: No acute abnormality of the cardiac and mediastinal silhouettes. BONES AND SOFT TISSUES: No acute osseous abnormality. IMPRESSION: 1. Bronchial cuffing within the right perihilar region, compatible with bronchitis and/or reactive airways disease. Electronically signed by: Waddell Calk MD 04/23/2024 05:56 AM EST RP Workstation: [id number]    Procedures Procedures (including critical care time)  Medications Ordered in ED Medications  albuterol  (PROVENTIL ,VENTOLIN ) solution continuous neb (has no administration in time range)  albuterol  (VENTOLIN  HFA) 108 (90 Base) MCG/ACT inhaler 2 puff (2 puffs Inhalation Given 04/23/24 0440)     Initial Impression / Assessment and Plan / ED Course  I have reviewed the triage vital signs and the nursing notes.  Pertinent labs & imaging results that were available during my care of the patient were reviewed by me and considered in my medical decision making (see chart for details).  Clinical Course as of 04/23/24 1249  Tue Apr 23, 2024  1121 On re-evaluation, the patient reports he is feeling significantly better and no longer has any chest tightness or SOB. Lungs are CTAB and he is moving air well. Will ambulate while checking pulse ox.  [RR]    Clinical Course User Index [RR] Bernis Ernst, PA-C    ***  Final Clinical Impressions(s) / ED Diagnoses   Final diagnoses:  None    New Prescriptions New Prescriptions   No medications on file

## 2024-04-23 NOTE — ED Notes (Signed)
 Pt to sort desk inquiring about wait time, informed pt and is understanding.

## 2024-04-23 NOTE — Discharge Instructions (Addendum)
 You were seen in the ER today for evalution of your shortness of breath. I am glad that you are feeling better! I am sending you in a stronger inhaler for you to use daily.  I have also refilled your emergency inhaler.  It is very important if you start using this daily inhaler as prescribed as it improves your asthma symptoms overall.  I am also sending you home on a steroid for you to take daily for the next 5 days.  Please avoid smoke exposure and vaping as this can lead to asthma attacks and exacerbations.  I have included information for a primary care provider for you to follow-up with as you will need continued care and evaluation for your asthma.  I have included additional information into the discharge report for you to review.  If you have any concerns, new or worsening symptoms, please return to your nearest emergency department for reevaluation.  Contact a health care provider if: You have wheezing, shortness of breath, or a cough that is not responding to medicines. Your medicines are causing side effects, such as a rash, itching, swelling, or trouble breathing. You need to use a reliever medicine more than 2-3 times a week. Your peak flow reading is still at 50-79% of your personal best after following your action plan for 1 hour. You have a fever and shortness of breath. Get help right away if: You are getting worse and do not respond to treatment during an asthma attack. You are short of breath when at rest or when doing very little physical activity. You have difficulty eating, drinking, or talking. You have chest pain or tightness. You develop a fast heartbeat or palpitations. You have a bluish color to your lips or fingernails. You are light-headed or dizzy, or you faint. Your peak flow reading is less than 50% of your personal best. You feel too tired to breathe normally. These symptoms may be an emergency. Get help right away. Call 911. Do not wait to see if the symptoms will  go away. Do not drive yourself to the hospital.

## 2024-04-23 NOTE — ED Provider Notes (Signed)
 [place] EMERGENCY DEPARTMENT AT [facility] Provider Note   CSN: [id number] Arrival date & time: 04/23/24  0425     Patient presents with: Shortness of Breath   Gavin Riley is a 28 y.o. male with h/o asthma presents to the ER today for evaluation of chest tightness with SOB. The patient reports that this feels like his previous asthma attacks.  He reports that yesterday he was sitting down watching television when he gradually noticed he was having some wheezing with chest tightness and shortness of breath.  Was not sudden in onset.  He denies any chest pain reports it is more tightness.  He has been trying to use his rescue inhaler more, even use it 5 times a day without any improvement of his symptoms.  He reports that he used a vape shortly before the onset of his symptoms which he contributes to the cause of his symptoms.  He reports that he often has to use the rescue inhaler multiple times a day for the past few months.  He has not followed up with primary care doctor on this. Denies any cough, runny nose, nasal congestion, fever, hemoptysis. He denies any recent long travel, recent surgery, exogenous hormone use, known hypercoagulable disorder, history of DVT/PE, leg swelling, or personal history of cancer. He denies any sick contacts.  He arrived via EMS with 2 DuoNebs, 2 g of mag, 125 of Solu-Medrol  with improvement of symptoms per patient.     Shortness of Breath Associated symptoms: no abdominal pain, no cough, no fever and no vomiting        Prior to Admission medications   Medication Sig Start Date End Date Taking? Authorizing Provider  acetaminophen  (TYLENOL ) 325 MG tablet Take 2 tablets (650 mg total) by mouth every 6 (six) hours as needed. 10/01/23   Elnor Jayson LABOR, DO  albuterol  (VENTOLIN  HFA) 108 (90 Base) MCG/ACT inhaler Inhale 1-2 puffs into the lungs every 6 (six) hours as needed for wheezing or shortness of breath. 04/30/23   Silver Wonda LABOR, PA   albuterol  (VENTOLIN  HFA) 108 (90 Base) MCG/ACT inhaler Inhale 1-2 puffs into the lungs every 4 (four) hours as needed for wheezing or shortness of breath. 10/01/23   Elnor Jayson LABOR, DO  budesonide -formoterol  (SYMBICORT ) 80-4.5 MCG/ACT inhaler Inhale 2 puffs into the lungs 2 (two) times daily. 03/21/20   Arloa Suzen GORMAN, NP  cetirizine  (ZYRTEC  ALLERGY) 10 MG tablet Take 1 tablet (10 mg total) by mouth daily. 03/30/23   Henderly, Britni A, PA-C  cyclobenzaprine  (FLEXERIL ) 10 MG tablet Take 1 tablet (10 mg total) by mouth 2 (two) times daily as needed for muscle spasms. 10/01/23   Elnor Jayson LABOR, DO  guaiFENesin -dextromethorphan (ROBITUSSIN DM) 100-10 MG/5ML syrup Take 5 mLs by mouth every 4 (four) hours as needed for cough. 10/01/23   Elnor Jayson LABOR, DO  ibuprofen  (ADVIL ) 600 MG tablet Take 1 tablet (600 mg total) by mouth every 6 (six) hours as needed. 10/01/23   Elnor Jayson LABOR, DO  lidocaine  (LIDODERM ) 5 % Place 1 patch onto the skin daily as needed. Remove & Discard patch within 12 hours or as directed by MD 10/01/23   Elnor Jayson LABOR, DO  oxymetazoline  (AFRIN NASAL SPRAY) 0.05 % nasal spray Place 1 spray into both nostrils 2 (two) times daily. 03/30/23   Henderly, Britni A, PA-C  saline (AYR) GEL Place 1 Application into both nostrils every 4 (four) hours as needed. 03/30/23   Henderly, Britni A, PA-C  triamcinolone  cream (KENALOG ) 0.1 % Apply 1 application topically 2 (two) times daily. 01/14/18   Windle Almarie ORN, PA-C    Allergies: Shellfish allergy    Review of Systems  Constitutional:  Negative for chills and fever.  HENT:  Negative for congestion and rhinorrhea.   Respiratory:  Positive for chest tightness and shortness of breath. Negative for cough.   Cardiovascular:  Negative for leg swelling.  Gastrointestinal:  Negative for abdominal pain, nausea and vomiting.    Updated Vital Signs BP (!) 146/95   Pulse 73   Temp 98.2 F (36.8 C) (Oral)   Resp 20   SpO2 97%   Physical  Exam Vitals and nursing note reviewed.  Constitutional:      Comments: Watching video on phone, in no acute distress  Cardiovascular:     Rate and Rhythm: Normal rate.  Pulmonary:     Effort: Pulmonary effort is normal. No respiratory distress.     Breath sounds: Decreased breath sounds present.     Comments: Find and inspiratory wheeze with diminished expiratory breath sounds in all lung fields.  He is speaking in full sentences with ease and is satting 97% on room air without any increased work of breathing. Musculoskeletal:     Right lower leg: No edema.     Left lower leg: No edema.  Skin:    General: Skin is warm and dry.  Neurological:     Mental Status: He is alert.     (all labs ordered are listed, but only abnormal results are displayed) Labs Reviewed  RESP PANEL BY RT-PCR (RSV, FLU A&B, COVID)  RVPGX2  CBC WITH DIFFERENTIAL/PLATELET  COMPREHENSIVE METABOLIC PANEL WITH GFR  TROPONIN I (HIGH SENSITIVITY)    EKG: EKG Interpretation Date/Time:  Tuesday April 23 2024 10:06:20 EST Ventricular Rate:  89 PR Interval:  162 QRS Duration:  104 QT Interval:  378 QTC Calculation: 460 R Axis:   88  Text Interpretation: Sinus rhythm Abnormal inferior Q waves Confirmed by Franklyn Gills [phone]) on 04/23/2024 10:26:29 AM  Radiology: DG Chest 2 View Result Date: 04/23/2024 EXAM: 2 VIEW(S) XRAY OF THE CHEST 04/23/2024 05:08:00 AM COMPARISON: 09/30/2023 CLINICAL HISTORY: SOB FINDINGS: LUNGS AND PLEURA: Bronchial cuffing within the right perihilar region identified. No focal pulmonary opacity. No pulmonary edema. No pleural effusion. No pneumothorax. HEART AND MEDIASTINUM: No acute abnormality of the cardiac and mediastinal silhouettes. BONES AND SOFT TISSUES: No acute osseous abnormality. IMPRESSION: 1. Bronchial cuffing within the right perihilar region, compatible with bronchitis and/or reactive airways disease. Electronically signed by: Waddell Calk MD 04/23/2024 05:56 AM EST RP  Workstation: [id number]   Procedures   Medications Ordered in the ED  albuterol  (PROVENTIL ) (2.5 MG/3ML) 0.083% nebulizer solution (has no administration in time range)  albuterol  (VENTOLIN  HFA) 108 (90 Base) MCG/ACT inhaler 2 puff (2 puffs Inhalation Given 04/23/24 0440)   Medical Decision Making Amount and/or Complexity of Data Reviewed Labs: ordered. Radiology: ordered.  Risk Prescription drug management.   28 y.o. male presents to the ER for evaluation of SOB, wheezing. Differential diagnosis includes but is not limited to CHF, pericardial effusion/tamponade, arrhythmias, ACS, COPD, asthma, bronchitis, pneumonia, pneumothorax, PE, anemia. Vital signs mildly elevated BP, otherwise unremarkable. Physical exam as noted above.   On previous chart evaluation, appears patient was prescribed Symbicort  in 2021.  When asked the patient about the inhaler, he does not remember why he is not taking it anymore.  He likely has more severe asthma than the mild intermittent that  is on his diagnosis.  He has already received 2 DuoNebs, Solu-Medrol , and mag.  He is still having diminished breath sounds however he is maintaining his oxygen  saturation.  CXR was ordered in triage. I have added labs and EKG. The patient reports that he is feeling better than he did on initial presentation. He is PERC negative and low Wells risk.  Denies any tearing or ripping chest pain.  Does not appear in acute distress.  Doubt any PE given  that the patient is PERC negative and low Wells risk.  Doubt any aneurysm or dissection. Will give him continuous albuterol  treatment and reassess.   I independently reviewed and interpreted the patient's labs. CBC without leukocytosis or anemia. CMP glucose 133 otherwise no electrolyte or LFT abnormality. Troponin 3 do not need repeat given time of onset of symptoms. Resp panel negative.   CXR shows 1. Bronchial cuffing within the right perihilar region, compatible with bronchitis and/or  reactive airways disease. Per radiologist's interpretation.    EKG reviewed and interpreted by my attending and read as Sinus rhythm Abnormal inferior Q waves. Similar to previous per signing attending.    Troponins are negative, EKG does not show any  ACS process.  Doubt any cardiac etiology.  Story sounds consistent with his asthma.  On re-evaluation after CAT, patient reports he feels significantly better and is no longer having any chest tightness or shortness of breath.  His lung sounds are clear to auscultation bilaterally and he is moving air well.  He ambulated and maintain oxygen  saturation around 94 to 95% he did not feel shortness of breath with doing so.  I have refilled the patient's Symbicort  even though he has not been on this in a few years.  Given his use of his albuterol  inhaler, I do feel he does not meet criteria for mild asthma and the longer.  Will send him home with the prednisone  burst for his bronchitis.  Discussed no vaping.  I given him the relation for the count of the mom's care for her to follow-up with PCP for further management and evaluation of his asthma.  He is stable for discharge home.   We discussed the results of the labs/imaging. The plan is daily inhaler, albuterol  inhaler, prednisone , follow-up PCP. We discussed strict return precautions and red flag symptoms. The patient verbalized their understanding and agrees to the plan. The patient is stable and being discharged home in good condition.  Portions of this report may have been transcribed using voice recognition software. Every effort was made to ensure accuracy; however, inadvertent computerized transcription errors may be present.    Final diagnoses:  Moderate asthma with exacerbation, unspecified whether persistent    ED Discharge Orders          Ordered    budesonide -formoterol  (SYMBICORT ) 80-4.5 MCG/ACT inhaler  2 times daily        04/23/24 1157    predniSONE  (DELTASONE ) 20 MG tablet  Daily         04/23/24 1157    albuterol  (VENTOLIN  HFA) 108 (90 Base) MCG/ACT inhaler  Every 4 hours PRN        04/23/24 1201               Bernis Ernst, PA-C 04/23/24 1232    Franklyn Sid SAILOR, MD 04/23/24 1244

## 2024-04-23 NOTE — ED Provider Triage Note (Signed)
 Emergency Medicine Provider Triage Evaluation Note  Gavin Riley , a 28 y.o. male  was evaluated in triage.  Pt complains of SOB.  Does have hx of asthma, tends to have issues when weather turns cold.  Out of home meds, has been getting from friends recently.  Given 2 duonebs, solumedrol and Mg+ with EMS.  States he still feels tight but improved from prior.  Denies cough, fever, or sick contacts.  Review of Systems  Positive: SOB Negative: fever  Physical Exam  BP (!) 144/98 (BP Location: Right Arm)   Pulse 67   Temp 98.1 F (36.7 C) (Oral)   Resp (!) 21   SpO2 96%  Gen:   Awake, no distress   Resp:  Normal effort  MSK:   Moves extremities without difficulty  Other:  Breathing easily, minimal wheezing, speaking in full sentences without difficulty  Medical Decision Making  Medically screening exam initiated at 4:45 AM.  Appropriate orders placed.  Edward BRAYON BIELEFELD was informed that the remainder of the evaluation will be completed by another provider, this initial triage assessment does not replace that evaluation, and the importance of remaining in the ED until their evaluation is complete.  SOB, appears to be due to asthma.  Out of home meds, has been using from friends when able.  Minimal wheezing now, vitals stable on RA.  Inhaler given, will obtain CXR.   Jarold Olam HERO, PA-C 04/23/24 [phone]

## 2024-04-23 NOTE — ED Triage Notes (Signed)
 Pt BIB [facility] from home d/t SOB. Pt sts this SOB feels like his asthma attacks in the past - worse in severity today. Took rescue inhaler x5 and 1 albuterol  neb tx at home without relief prior to calling EMS. EMS arrival pt still had labored breathing with wheezing. Was given Duo neb x2, 2 g mg sulfate, 125 soul medrol  with improvement. Denies cough/fever.

## 2024-04-23 NOTE — ED Notes (Addendum)
 Pt tolerated ambulating with pulse ox well. Pt pulse maintained between 75-90. SpO2 between 94-95. Pt denies shortness of breath.

## 2024-04-23 NOTE — ED Notes (Signed)
 Patient tachycardic on monitor. This RN has rounded on patient, asking if he feels short of breath at all. Pt denies and states he was texting a rapper and that's probably why his heart rate is high. Respirations regular and unlabored.
